# Patient Record
Sex: Male | Born: 1985 | Hispanic: No | Marital: Married | Smoking: Former smoker
Health system: Southern US, Community
[De-identification: ages and names within clinical notes are randomized; demographics above are authoritative.]

## PROBLEM LIST (undated history)

## (undated) DIAGNOSIS — K219 Gastro-esophageal reflux disease without esophagitis: Secondary | ICD-10-CM

## (undated) DIAGNOSIS — F419 Anxiety disorder, unspecified: Secondary | ICD-10-CM

## (undated) DIAGNOSIS — B9681 Helicobacter pylori [H. pylori] as the cause of diseases classified elsewhere: Secondary | ICD-10-CM

## (undated) DIAGNOSIS — R1013 Epigastric pain: Secondary | ICD-10-CM

## (undated) DIAGNOSIS — F32A Depression, unspecified: Secondary | ICD-10-CM

## (undated) HISTORY — DX: Gastro-esophageal reflux disease without esophagitis: K21.9

## (undated) HISTORY — DX: Helicobacter pylori (H. pylori) as the cause of diseases classified elsewhere: B96.81

## (undated) HISTORY — DX: Anxiety disorder, unspecified: F41.9

## (undated) HISTORY — DX: Epigastric pain: R10.13

## (undated) HISTORY — DX: Depression, unspecified: F32.A

---

## 2020-01-05 ENCOUNTER — Ambulatory Visit (HOSPITAL_COMMUNITY)
Admission: EM | Admit: 2020-01-05 | Discharge: 2020-01-05 | Disposition: A | Discharge status: Home / Self Care | Payer: Self-pay | Attending: Family Medicine | Admitting: Family Medicine

## 2020-01-05 DIAGNOSIS — R1013 Epigastric pain: Secondary | ICD-10-CM

## 2020-01-05 DIAGNOSIS — Z789 Other specified health status: Secondary | ICD-10-CM

## 2020-01-05 MED ORDER — SUCRALFATE 1 G PO TABS: 1.0000 g | ORAL_TABLET | Freq: Three times a day (TID) | ORAL | 0 refills | Status: DC

## 2020-01-05 NOTE — ED Triage Notes (Signed)
Via friend who speaks English  Pt here for medication refill  Hx of H. Pylori and Vertigo

## 2020-01-05 NOTE — Discharge Instructions (Addendum)
This is an urgent care center for minor medical problems We cannot do testing to diagnose your ongoing medical problems Call your refugee representative or social worker to obtain a primary care doctor  I will prescribe medicine for your stomach pain Carafate can be taken 4 times a day

## 2020-01-07 NOTE — ED Provider Notes (Signed)
Unable to complete a medical visit Patient speaks no english Unable after over an hour of trying to locate an interpreter for Pashto He called a colleague who speaks broken english that tells me patient came from refugee center 6 days ago and wants evaluation for chronic abdominal pain He has a Rx for omeprazole I am not able to get additional information to treat safely Is discharged to return another time,needs to call his refugee contacts   Eustace Moore, MD 01/07/20 1825

## 2020-01-26 ENCOUNTER — Ambulatory Visit (HOSPITAL_COMMUNITY)
Admission: EM | Admit: 2020-01-26 | Discharge: 2020-01-26 | Disposition: A | Discharge status: Home / Self Care | Payer: Medicaid Other | Attending: Student | Admitting: Student

## 2020-01-26 ENCOUNTER — Other Ambulatory Visit: Payer: Self-pay

## 2020-01-26 DIAGNOSIS — Z8619 Personal history of other infectious and parasitic diseases: Secondary | ICD-10-CM

## 2020-01-26 DIAGNOSIS — K279 Peptic ulcer, site unspecified, unspecified as acute or chronic, without hemorrhage or perforation: Secondary | ICD-10-CM | POA: Diagnosis not present

## 2020-01-26 DIAGNOSIS — R519 Headache, unspecified: Secondary | ICD-10-CM | POA: Diagnosis not present

## 2020-01-26 DIAGNOSIS — R1013 Epigastric pain: Secondary | ICD-10-CM | POA: Diagnosis not present

## 2020-01-26 MED ORDER — DOCUSATE SODIUM 250 MG PO CAPS: 250.0000 mg | ORAL_CAPSULE | Freq: Every day | ORAL | 0 refills | Status: DC

## 2020-01-26 MED ORDER — FAMOTIDINE 20 MG PO TABS: 20.0000 mg | ORAL_TABLET | Freq: Two times a day (BID) | ORAL | 2 refills | Status: DC

## 2020-01-26 MED ORDER — OMEPRAZOLE 40 MG PO CPDR: 40.0000 mg | DELAYED_RELEASE_CAPSULE | Freq: Every day | ORAL | 2 refills | Status: DC

## 2020-01-26 NOTE — ED Triage Notes (Signed)
Pt presents with abdominal pain and headaches X 2 years. He states he just came from his country and has not been seen for the pain and states now the pain is worse. Pt states when he eats food he cannot hold anything down.

## 2020-01-26 NOTE — ED Provider Notes (Addendum)
[facility]    CSN: [id number] Arrival date & time: 01/26/20  1045      History   Chief Complaint Chief Complaint  Patient presents with  . Abdominal Pain  . Headache    HPI Kevin Space is a 35 y.o. male presenting with abdominal pain and headaches for >2 years. This patient is a refugee and has not established care with PCP or specialists yet. He has a long history of GI issues, and was treated for peptic ulcer/H pylori in his home country. Today he is presenting for 2+ years of abdominal pain. States he was treated for peptic ulcer in home country, and was also treated for H Pylori. However, for the last 2 years, endorses epigastric pain that is relieved by eating and defecation. Endorses constipation and straining on the toilet. During episodes of pain and constipation, he experiences headaches and dizziness. Symptoms are poorly controlled by Omeprazole. Denies worst headache of life, thunderclap headache, weakness/sensation changes in arms/legs, vision changes, shortness of breath, chest pain/pressure, photophobia, phonophobia, n/v/d. Denies red stool or black/tarry stool. He is having one bowel movement daily. Denies hearing changes, vision changes, tinnitus.   HPI  No past medical history on file.  There are no problems to display for this patient.    Home Medications    Prior to Admission medications   Medication Sig Start Date End Date Taking? Authorizing Provider  docusate sodium (COLACE) 250 MG capsule Take 1 capsule (250 mg total) by mouth daily. 01/26/20  Yes Rhys Martini, PA-C  famotidine (PEPCID) 20 MG tablet Take 1 tablet (20 mg total) by mouth 2 (two) times daily. 01/26/20  Yes Rhys Martini, PA-C  omeprazole (PRILOSEC) 40 MG capsule Take 1 capsule (40 mg total) by mouth daily. 01/26/20 02/25/20 Yes Rhys Martini, PA-C  sucralfate (CARAFATE) 1 g tablet Take 1 tablet (1 g total) by mouth 4 (four) times daily -  with meals and at bedtime. 01/05/20   Yes Eustace Moore, MD  calcium carbonate (TUMS - DOSED IN MG ELEMENTAL CALCIUM) 500 MG chewable tablet Chew 1 tablet by mouth 3 (three) times daily.    [provider]  hydrOXYzine (ATARAX/VISTARIL) 25 MG tablet Take 25 mg by mouth 3 (three) times daily.    [provider]  meclizine (ANTIVERT) 12.5 MG tablet Take 12.5 mg by mouth 3 (three) times daily.    [provider]    Family History No family history on file.  Social History     Allergies   Patient has no known allergies.   Review of Systems Review of Systems  Gastrointestinal: Positive for abdominal pain and constipation.  Neurological: Positive for dizziness and headaches.  All other systems reviewed and are negative.    Physical Exam Triage Vital Signs ED Triage Vitals [01/26/20 1201]  Enc Vitals Group     BP 124/72     Pulse Rate 65     Resp 16     Temp 97.8 F (36.6 C)     Temp Source Oral     SpO2 100 %     Weight      Height      Head Circumference      Peak Flow      Pain Score      Pain Loc      Pain Edu?      Excl. in GC?    No data found.  Updated Vital Signs BP 124/72   Pulse  65   Temp 97.8 F (36.6 C) (Oral)   Resp 16   SpO2 100%   Visual Acuity Right Eye Distance:   Left Eye Distance:   Bilateral Distance:    Right Eye Near:   Left Eye Near:    Bilateral Near:     Physical Exam Vitals reviewed.  Constitutional:      General: He is not in acute distress.    Appearance: Normal appearance. He is not ill-appearing.  HENT:     Head: Normocephalic and atraumatic.  Cardiovascular:     Rate and Rhythm: Normal rate and regular rhythm.     Heart sounds: Normal heart sounds.  Pulmonary:     Effort: Pulmonary effort is normal.     Breath sounds: Normal breath sounds. No wheezing, rhonchi or rales.  Abdominal:     General: Bowel sounds are normal. There is no distension.     Palpations: Abdomen is soft. There is no mass.     Tenderness: There is  abdominal tenderness in the epigastric area. There is no right CVA tenderness, left CVA tenderness, guarding or rebound. Negative signs include Murphy's sign, Rovsing's sign and McBurney's sign.     Comments: Bowel sounds positive in all 4 quadrants.  Negative Murphy Sign, Rovsing's sign, McBurney point tenderness.   Musculoskeletal:     Cervical back: Normal range of motion and neck supple. No rigidity.  Lymphadenopathy:     Cervical: No cervical adenopathy.  Neurological:     General: No focal deficit present.     Mental Status: He is alert and oriented to person, place, and time. Mental status is at baseline.     Cranial Nerves: Cranial nerves are intact. No cranial nerve deficit or facial asymmetry.     Sensory: Sensation is intact. No sensory deficit.     Motor: Motor function is intact. No weakness.     Coordination: Coordination is intact. Coordination normal.     Gait: Gait is intact. Gait normal.     Comments: CN 2-12 intact. No weakness or numbness in UEs or LEs.  Psychiatric:        Mood and Affect: Mood normal.        Behavior: Behavior normal.        Thought Content: Thought content normal.        Judgment: Judgment normal.      UC Treatments / Results  Labs (all labs ordered are listed, but only abnormal results are displayed) Labs Reviewed - No data to display  EKG   Radiology No results found.  Procedures Procedures (including critical care time)  Medications Ordered in UC Medications - No data to display  Initial Impression / Assessment and Plan / UC Course  I have reviewed the triage vital signs and the nursing notes.  Pertinent labs & imaging results that were available during my care of the patient were reviewed by me and considered in my medical decision making (see chart for details).     For peptic ulcer disease/history of H Pylori, continue omeprazole. Trial of Pepcid and colace. rec good hydration, fibrous diet. Discussed that patient needs to  be followed by GI for improved control of his chronic GI issues. Encouraged him to f/u with GI if he continues to experience these symptoms despite treatment; information provided.   For headaches and dizziness, benign neuro exam today. Tylenol/ibuprofen. F/u with [place] or PCP if headaches persist despite treatment. Head straight to ED if worst headache of life, syncope,  etc.   Using language line, spent over 40 minutes obtaining H&P, performing physical, discussing results, treatment plan and plan for follow-up with patient. Patient agrees with plan.    Final Clinical Impressions(s) / UC Diagnoses   Final diagnoses:  History of Helicobacter pylori infection  Epigastric pain  Peptic ulcer  Nonintractable headache, unspecified chronicity pattern, unspecified headache type     Discharge Instructions     -Continue taking Omeprazole as prescribed by your previous doctor. Refill provided today -Start Famotidine, twice daily with meals  -Try Colace stool softener 1 capsule daily for constipation. You can take this as needed.  -If your symptoms worsen/persist despite treatment, please follow-up with gastroenterologist. Information provided below.    ED Prescriptions    Medication Sig Dispense Auth. Provider   omeprazole (PRILOSEC) 40 MG capsule Take 1 capsule (40 mg total) by mouth daily. 30 capsule Rhys Martini, PA-C   famotidine (PEPCID) 20 MG tablet Take 1 tablet (20 mg total) by mouth 2 (two) times daily. 30 tablet Rhys Martini, PA-C   docusate sodium (COLACE) 250 MG capsule Take 1 capsule (250 mg total) by mouth daily. 10 capsule Rhys Martini, PA-C     PDMP not reviewed this encounter.   Rhys Martini, PA-C 01/26/20 1348    Rhys Martini, PA-C 01/26/20 1348

## 2020-01-26 NOTE — Discharge Instructions (Addendum)
-  Continue taking Omeprazole as prescribed by your previous doctor. Refill provided today -Start Famotidine, twice daily with meals  -Try Colace stool softener 1 capsule daily for constipation. You can take this as needed.  -If your symptoms worsen/persist despite treatment, please follow-up with gastroenterologist. Information provided below.

## 2020-02-01 ENCOUNTER — Other Ambulatory Visit: Payer: Self-pay

## 2020-02-01 DIAGNOSIS — L02413 Cutaneous abscess of right upper limb: Secondary | ICD-10-CM

## 2020-02-01 LAB — GLUCOSE, POCT (MANUAL RESULT ENTRY): POC Glucose: 95 mg/dl (ref 70–99)

## 2020-02-01 NOTE — Progress Notes (Signed)
CBG 95. Patient advised to seek medical attention if pain/swelling and redness is worse,or has a fever.He is to take OTC analgesics for pain control.  Arman Bogus RN BSn PCCN  [facility] [phone]-cell [phone]-office

## 2020-02-01 NOTE — Congregational Nurse Program (Signed)
Patient came in with c/o swelling and redness right arm. Looks like an abscess  He has appointment with PCP as new patient 02/09/20 at 3;15PM. He would like to wait for the and see the provider at that time.Blood sugar check 95.  Arman Bogus RN BSn PCCN  [facility] [phone]-cell [phone]-office

## 2020-02-09 ENCOUNTER — Encounter: Payer: Self-pay | Admitting: Student

## 2020-02-09 ENCOUNTER — Other Ambulatory Visit: Payer: Self-pay

## 2020-02-09 ENCOUNTER — Ambulatory Visit: Payer: Medicaid Other | Admitting: Student

## 2020-02-09 VITALS — BP 118/67 | HR 74 | Wt 175.9 lb

## 2020-02-09 DIAGNOSIS — K2970 Gastritis, unspecified, without bleeding: Secondary | ICD-10-CM | POA: Diagnosis not present

## 2020-02-09 DIAGNOSIS — K30 Functional dyspepsia: Secondary | ICD-10-CM | POA: Insufficient documentation

## 2020-02-09 DIAGNOSIS — R1013 Epigastric pain: Secondary | ICD-10-CM | POA: Diagnosis not present

## 2020-02-09 DIAGNOSIS — K219 Gastro-esophageal reflux disease without esophagitis: Secondary | ICD-10-CM | POA: Insufficient documentation

## 2020-02-09 DIAGNOSIS — Z8619 Personal history of other infectious and parasitic diseases: Secondary | ICD-10-CM | POA: Diagnosis not present

## 2020-02-09 DIAGNOSIS — B9681 Helicobacter pylori [H. pylori] as the cause of diseases classified elsewhere: Secondary | ICD-10-CM | POA: Diagnosis not present

## 2020-02-09 DIAGNOSIS — K921 Melena: Secondary | ICD-10-CM

## 2020-02-09 MED ORDER — HYDROXYZINE HCL 25 MG PO TABS: 25.0000 mg | ORAL_TABLET | Freq: Three times a day (TID) | ORAL | 2 refills | Status: DC

## 2020-02-09 MED ORDER — OMEPRAZOLE 40 MG PO CPDR
40.0000 mg | DELAYED_RELEASE_CAPSULE | Freq: Every day | ORAL | 2 refills | Status: DC
Start: 2020-02-09 — End: 2020-03-09

## 2020-02-09 MED ORDER — CALCIUM CARBONATE ANTACID 500 MG PO CHEW: 1.0000 | CHEWABLE_TABLET | Freq: Three times a day (TID) | ORAL | 2 refills | Status: DC

## 2020-02-09 NOTE — Patient Instructions (Signed)
??    ?  ~??~ ~?   ? ? .  ? ? ? ~??:     GI   ~??. ?      ?  ? .   ~?  ? ? ~? ? .    ? ?   ~? ?   ? ~?  . ?  ?   ?  ??   ?!    Oscar Hill,  It was a pleasure seeing you in the clinic today.   We discussed the following:  1. I have placed a referral to GI. They will call you to schedule an appointment.  2. Please come back in 1 month.  Please call our clinic at [phone] if you have any questions or concerns. The best time to call is Monday-Friday from 9am-4pm, but there is someone available 24/7 at the same number. If you need medication refills, please notify your pharmacy one week in advance and they will send [place] a request.   Thank you for letting us take part in your care. We look forward to seeing you next time!

## 2020-02-09 NOTE — Assessment & Plan Note (Signed)
Patient with history of severe GERD on omeprazole 40mg  daily and tums, still not well controlled. I have placed a referral to GI for further evaluation of this and his abdominal pain (see separate problem) because I believe that both are likely interrelated.  -continue omeprazole 40mg  daily -continue tums TID prn -GI referral

## 2020-02-09 NOTE — Assessment & Plan Note (Signed)
Patient presenting with epigastric pain that has been ongoing for 1.5 years, but has been progressively worsening over the past 5 months. He reports that the pain increases with food intake and is significantly worse at night when lays down. He feels like it is heartburn, but it does not improve with daily omeprazole and tums. He does report that he has some symptomatic relief when he takes omeprazole twice. He reports associated loss of appetite and he does endorse occasional melena as well. He denies fevers, chills, N/V, hematochezia, hx of cancers. Patient does have a history of H pylori gastritis that he reports he was treated for in [place] (with confirmed eradication as per patient). Patient does have a history of tobacco use and currently smokes 1 cigarette/day.   At this point, my differentials include repeat infection with H pylori vs refractory GERD. Unfortunately, I cannot test for H pylori at this time as patient has been regularly taking his PPI. Patient does have red flag symptoms including failure of PPI therapy and melena, so I will place urgent referral to GI for further evaluation of his symptoms.  Plan: -continue PPI and tums -urgent GI referral -f/u in 1 month

## 2020-02-09 NOTE — Progress Notes (Signed)
   CC: abdominal pain  HPI:  [name] is a 35 y.o. male with   Past Medical History:  Diagnosis Date  . GERD (gastroesophageal reflux disease)   . Helicobacter pylori gastritis      No past surgical history.  Medications: Omeprazole 40mg  daily Tums 500mg  TID prn Hydroxyzine 25mg    Allergies: Reports body shaking with sucralfate and famotidine  Social Hx: Lives with brother here in [place]. Works in . Reports smoking 1 cigarette/day after coming to [place] but in he used to smoke local tobacco quite a bit. No alcohol or recreational drug use.  Family Hx: Negative pertinent family hx  Review of Systems:  Negative aside from that listed in individualized A&P.  Physical Exam:  Vitals:   02/09/20 1522  BP: 118/67  Pulse: 74  SpO2: 98%  Weight: 175 lb 14.4 oz (79.8 kg)   Physical Exam Constitutional:      Appearance: He is well-developed. He is not ill-appearing.  HENT:     Head: Normocephalic and atraumatic.  Eyes:     Extraocular Movements: Extraocular movements intact.     Pupils: Pupils are equal, round, and reactive to light.     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 Denies pain.  Cardiovascular:     Rate and Rhythm: Normal rate and regular rhythm.     Heart sounds: Normal heart sounds. No murmur heard. No friction rub. No gallop.   Pulmonary:     Effort: Pulmonary effort is normal.     Breath sounds: Normal breath sounds. No wheezing, rhonchi or rales.  Abdominal:     General: There is distension.     Comments: Soft, slightly distended abdomen, TTP in epigastric region. +BS. Negative Rovsings and McBurney's.  Skin:    General: Skin is warm and dry.  Neurological:     General: No focal deficit present.     Mental Status: He is alert and oriented to person, place, and time.  Psychiatric:        Mood and Affect: Mood normal.        Behavior: Behavior normal.      Assessment & Plan:   See  Encounters Tab for problem based charting.  Patient discussed with Dr. Event organiser

## 2020-02-09 NOTE — Assessment & Plan Note (Signed)
Patient with history of H pylori gastritis in [place]. Reports that he was treated for it and had confirmed eradication of it while in [place].

## 2020-02-13 NOTE — Progress Notes (Signed)
Internal Medicine Clinic Attending  Case discussed with Dr. Jinwala  At the time of the visit.  We reviewed the resident's history and exam and pertinent patient test results.  I agree with the assessment, diagnosis, and plan of care documented in the resident's note.  

## 2020-02-23 ENCOUNTER — Encounter: Payer: Self-pay | Admitting: Physician Assistant

## 2020-02-24 ENCOUNTER — Encounter: Payer: Self-pay | Admitting: Physician Assistant

## 2020-03-08 ENCOUNTER — Encounter: Payer: Medicaid Other | Admitting: Internal Medicine

## 2020-03-09 ENCOUNTER — Other Ambulatory Visit: Payer: Self-pay

## 2020-03-09 ENCOUNTER — Other Ambulatory Visit (INDEPENDENT_AMBULATORY_CARE_PROVIDER_SITE_OTHER): Payer: Medicaid Other

## 2020-03-09 ENCOUNTER — Encounter: Payer: Self-pay | Admitting: Physician Assistant

## 2020-03-09 ENCOUNTER — Ambulatory Visit: Payer: Medicaid Other | Admitting: Physician Assistant

## 2020-03-09 VITALS — BP 90/60 | HR 70 | Ht 68.0 in | Wt 173.0 lb

## 2020-03-09 DIAGNOSIS — R1013 Epigastric pain: Secondary | ICD-10-CM

## 2020-03-09 DIAGNOSIS — R42 Dizziness and giddiness: Secondary | ICD-10-CM

## 2020-03-09 DIAGNOSIS — K219 Gastro-esophageal reflux disease without esophagitis: Secondary | ICD-10-CM | POA: Diagnosis not present

## 2020-03-09 DIAGNOSIS — Z8619 Personal history of other infectious and parasitic diseases: Secondary | ICD-10-CM

## 2020-03-09 LAB — CBC WITH DIFFERENTIAL/PLATELET
Basophils Absolute: 0 10*3/uL (ref 0.0–0.1)
Basophils Relative: 0.7 % (ref 0.0–3.0)
Eosinophils Absolute: 0.1 10*3/uL (ref 0.0–0.7)
Eosinophils Relative: 2.8 % (ref 0.0–5.0)
HCT: 44.7 % (ref 39.0–52.0)
Hemoglobin: 15.5 g/dL (ref 13.0–17.0)
Lymphocytes Relative: 43 % (ref 12.0–46.0)
Lymphs Abs: 2 10*3/uL (ref 0.7–4.0)
MCHC: 34.7 g/dL (ref 30.0–36.0)
MCV: 87.9 fl (ref 78.0–100.0)
Monocytes Absolute: 0.4 10*3/uL (ref 0.1–1.0)
Monocytes Relative: 7.8 % (ref 3.0–12.0)
Neutro Abs: 2.1 10*3/uL (ref 1.4–7.7)
Neutrophils Relative %: 45.7 % (ref 43.0–77.0)
Platelets: 219 10*3/uL (ref 150.0–400.0)
RBC: 5.08 Mil/uL (ref 4.22–5.81)
RDW: 12.6 % (ref 11.5–15.5)
WBC: 4.7 10*3/uL (ref 4.0–10.5)

## 2020-03-09 LAB — COMPREHENSIVE METABOLIC PANEL
ALT: 12 U/L (ref 0–53)
AST: 14 U/L (ref 0–37)
Albumin: 4.1 g/dL (ref 3.5–5.2)
Alkaline Phosphatase: 80 U/L (ref 39–117)
BUN: 8 mg/dL (ref 6–23)
CO2: 29 mEq/L (ref 19–32)
Calcium: 9.1 mg/dL (ref 8.4–10.5)
Chloride: 108 mEq/L (ref 96–112)
Creatinine, Ser: 0.76 mg/dL (ref 0.40–1.50)
GFR: 115.84 mL/min (ref >60.00)
Glucose, Bld: 94 mg/dL (ref 70–99)
Potassium: 3.9 mEq/L (ref 3.5–5.1)
Sodium: 141 mEq/L (ref 135–145)
Total Bilirubin: 1 mg/dL (ref 0.2–1.2)
Total Protein: 6.9 g/dL (ref 6.0–8.3)

## 2020-03-09 LAB — LIPASE: Lipase: 58 U/L (ref 11.0–59.0)

## 2020-03-09 MED ORDER — OMEPRAZOLE 40 MG PO CPDR: 40.0000 mg | DELAYED_RELEASE_CAPSULE | Freq: Every day | ORAL | 5 refills | Status: DC

## 2020-03-09 NOTE — Progress Notes (Signed)
Chief Complaint: GERD  HPI:    Oscar Hill is a 35 year old male from [place], with a past medical history as listed below, who presents clinic today accompanied by an interpreter, who was referred to me by Oscar Puma, MD for a complaint of GERD.      01/26/2020 patient seen in the urgent care for abdominal pain and headaches.  Described a long history of GI issues treated for peptic ulcer/H. pylori in his home country.  Described epigastric pain that was relieved by eating and defecation for the past 2 years as well as some constipation.  At that time continued on omeprazole 40 mg daily and started Pepcid 20 mg twice daily with meals as well as a Colace stool softener once daily.    02/09/2020 patient seen by PCP and at that time reported an allergy to sucralfate and famotidine with "shaking".  He described epigastric pain had been going on for almost 2 years progressively worsening the past 5 months.  Did not improve with daily omeprazole and Tums.  Associated loss of appetite and some melena.  At that time reported that he was treated in [place] for H. pylori with confirmed eradication.    Today, the patient presents to clinic accompanied by interpreter.  He brings with him records that show he had H pylori back 12/26/2018 and apparently had a fecal antigen test that was negative at that time.  Describes being treated for this while he lived in [place].  Tells me he continues now with epigastric pain and burning.  He feels like when his stomach is hurting then it radiates all the way up his throat and into his head and he develops a headache and will get dizzy.  Tells me this is worse after eating anything or when he is "thinking about something stressful", like his family left in [place].  Describes having melena for a straight 3 months but the last time he saw any black tarry stools about a month ago.  Currently using Omeprazole twice daily which does help some with symptoms.    Denies  fever, chills, weight loss or symptoms that awaken him from sleep.  Past Medical History:  Diagnosis Date  . GERD (gastroesophageal reflux disease)   . Helicobacter pylori gastritis     Current Outpatient Medications  Medication Sig Dispense Refill  . calcium carbonate (TUMS - DOSED IN MG ELEMENTAL CALCIUM) 500 MG chewable tablet Chew 1 tablet (200 mg of elemental calcium total) by mouth 3 (three) times daily. 90 tablet 2  . docusate sodium (COLACE) 250 MG capsule Take 1 capsule (250 mg total) by mouth daily. 10 capsule 0  . famotidine (PEPCID) 20 MG tablet Take 1 tablet (20 mg total) by mouth 2 (two) times daily. 30 tablet 2  . hydrOXYzine (ATARAX/VISTARIL) 25 MG tablet Take 1 tablet (25 mg total) by mouth 3 (three) times daily. 30 tablet 2  . meclizine (ANTIVERT) 12.5 MG tablet Take 12.5 mg by mouth 3 (three) times daily.    Marland Kitchen omeprazole (PRILOSEC) 40 MG capsule Take 1 capsule (40 mg total) by mouth daily. 30 capsule 2  . sucralfate (CARAFATE) 1 g tablet Take 1 tablet (1 g total) by mouth 4 (four) times daily -  with meals and at bedtime. 60 tablet 0   No current facility-administered medications for this visit.    Allergies as of 03/09/2020  . (No Known Allergies)    Family History  Problem Relation Age of Onset  . Healthy Mother   .  Healthy Father     Social History   Socioeconomic History  . Marital status: Unknown    Spouse name: Not on file  . Number of children: Not on file  . Years of education: Not on file  . Highest education level: Not on file  Occupational History  . Occupation: carpentry  Tobacco Use  . Smoking status: Current Every Day Smoker    Types: Cigarettes  . Smokeless tobacco: Former Neurosurgeon  . Tobacco comment: Used to smoke local tobacco in [place], now smokes 1 cigarette/day here.  Substance and Sexual Activity  . Alcohol use: Never  . Drug use: Never  . Sexual activity: Not on file  Other Topics Concern  . Not on file  Social History  Narrative  . Not on file   Social Determinants of Health   Financial Resource Strain: Not on file  Food Insecurity: Not on file  Transportation Needs: Not on file  Physical Activity: Not on file  Stress: Not on file  Social Connections: Not on file  Intimate Partner Violence: Not on file    Review of Systems:    Constitutional: No weight loss, fever or chills Skin: No rash Cardiovascular: No chest pain  Respiratory: No SOB  Gastrointestinal: See HPI and otherwise negative Genitourinary: No dysuria  Neurological: No headache, dizziness or syncope Musculoskeletal: No new muscle or joint pain Hematologic: No bleeding  Psychiatric: No history of depression or anxiety   Physical Exam:  Vital signs: BP 90/60   Pulse 70   Ht 5\' 8"  (1.727 m)   Wt 173 lb (78.5 kg)   BMI 26.30 kg/m   Constitutional:   Pleasant male appears to be in NAD, Well developed, Well nourished, alert and cooperative Head:  Normocephalic and atraumatic. Eyes:   PEERL, EOMI. No icterus. Conjunctiva pink. Ears:  Normal auditory acuity. Neck:  Supple Throat: Oral cavity and pharynx without inflammation, swelling or lesion.  Respiratory: Respirations even and unlabored. Lungs clear to auscultation bilaterally.   No wheezes, crackles, or rhonchi.  Cardiovascular: Normal S1, S2. No MRG. Regular rate and rhythm. No peripheral edema, cyanosis or pallor.  Gastrointestinal:  Soft, nondistended, moderate epigastric TTP with involuntary guarding.  Normal bowel sounds. No appreciable masses or hepatomegaly. Rectal:  Not performed.  Msk:  Symmetrical without gross deformities. Without edema, no deformity or joint abnormality.  Neurologic:  Alert and  oriented x4;  grossly normal neurologically.  Skin:   Dry and intact without significant lesions or rashes. Psychiatric:  Demonstrates good judgement and reason without abnormal affect or behaviors.  No recent labs or imaging.  Assessment: 1. Epigastric pain: Worse  after eating and when anxious; likely gastritis 2. GERD: Continuous symptoms per the patient, some better with omeprazole twice daily 3. History of H. pylori: Negative fecal antigen 12/26/2018  Plan: 1. Scheduled patient for an EGD in the [facility] with Dr. 12/28/2018. Patient was provided with a detailed list of risks for the procedure and he agrees to proceed. 2. Refilled Omeprazole 40 mg twice daily, 30-60 minutes before breakfast and dinner #60 with five refills. 3. Reviewed patient's records that he brought with him, some of these are in a different language and cannot be understood, but H. pylori results are as in HPI. 4. Patient was provided with a work note for today. 5. Ordered labs including CBC, CMP and lipase 6. Patient to follow in clinic per recommendations of Dr. Meridee Score after time of procedure.  Meridee Score, PA-C [facility] 03/09/2020, 9:09 AM  Cc: Oscar Puma, MD

## 2020-03-09 NOTE — Patient Instructions (Signed)
If you are age 37 or older, your body mass index should be between 23-30. Your Body mass index is 26.3 kg/m. If this is out of the aforementioned range listed, please consider follow up with your Primary Care Provider.  If you are age 38 or younger, your body mass index should be between 19-25. Your Body mass index is 26.3 kg/m. If this is out of the aformentioned range listed, please consider follow up with your Primary Care Provider.   Your provider has requested that you go to the basement level for lab work before leaving today. Press "B" on the elevator. The lab is located at the first door on the left as you exit the elevator.  Due to recent changes in healthcare laws, you may see the results of your imaging and laboratory studies on MyChart before your provider has had a chance to review them.  We understand that in some cases there may be results that are confusing or concerning to you. Not all laboratory results come back in the same time frame and the provider may be waiting for multiple results in order to interpret others.  Please give [place] 48 hours in order for your provider to thoroughly review all the results before contacting the office for clarification of your results.   Thank you for choosing me and  Gastroenterology.  Hyacinth Meeker, PA-C

## 2020-03-09 NOTE — Progress Notes (Signed)
Pashto interpreter was not available 03/09/20 will call back later.

## 2020-03-10 NOTE — Progress Notes (Signed)
Attending Physician's Attestation   I have reviewed the chart.   I agree with the Advanced Practitioner's note, impression, and recommendations with any updates as below.    Gabriel Mansouraty, MD [facility]oscopy Office # [phone]  

## 2020-03-12 ENCOUNTER — Encounter: Payer: Medicaid Other | Admitting: Internal Medicine

## 2020-03-20 ENCOUNTER — Encounter: Payer: Self-pay | Admitting: Internal Medicine

## 2020-03-20 ENCOUNTER — Ambulatory Visit: Payer: Medicaid Other | Admitting: Internal Medicine

## 2020-03-20 DIAGNOSIS — F4321 Adjustment disorder with depressed mood: Secondary | ICD-10-CM

## 2020-03-20 DIAGNOSIS — R1013 Epigastric pain: Secondary | ICD-10-CM | POA: Diagnosis present

## 2020-03-20 MED ORDER — PEPTO-BISMOL 524 MG/30ML PO SUSP: 30.0000 mL | Freq: Three times a day (TID) | ORAL | 2 refills | Status: DC | PRN

## 2020-03-20 MED ORDER — SERTRALINE HCL 50 MG PO TABS: 50.0000 mg | ORAL_TABLET | Freq: Every day | ORAL | 2 refills | Status: DC

## 2020-03-20 NOTE — Progress Notes (Signed)
   CC: Abdominal Pain   HPI:  [name] is a 35 y.o. M/F, with a PMH noted below, who presents to the clinic follow up on his abdominal pain. To see the management of their acute and chronic conditions, please see the A&P note under the Encounters tab.   Past Medical History:  Diagnosis Date  . GERD (gastroesophageal reflux disease)   . Helicobacter pylori gastritis    Review of Systems:   Review of Systems  Constitutional: Negative for chills, fever, malaise/fatigue and weight loss.  Gastrointestinal: Positive for abdominal pain and nausea. Negative for blood in stool, constipation, diarrhea, heartburn and vomiting.  Neurological: Negative for dizziness.     Physical Exam:  Vitals:   03/20/20 0921  BP: 118/72  Pulse: 60  SpO2: 100%  Weight: 172 lb 9.6 oz (78.3 kg)   Physical Exam Constitutional:      General: He is not in acute distress.    Appearance: Normal appearance. He is not ill-appearing or toxic-appearing.  Cardiovascular:     Rate and Rhythm: Normal rate and regular rhythm.     Pulses: Normal pulses.     Heart sounds: Normal heart sounds. No murmur heard. No friction rub. No gallop.   Pulmonary:     Effort: Pulmonary effort is normal. No respiratory distress.     Breath sounds: No wheezing, rhonchi or rales.  Abdominal:     General: Abdomen is flat. Bowel sounds are normal.     Palpations: Abdomen is soft. There is no mass.     Hernia: No hernia is present.     Comments: Mild tenderness in the epigastric region to palpitation  Neurological:     Mental Status: He is alert and oriented to person, place, and time.  Psychiatric:        Mood and Affect: Mood normal.        Behavior: Behavior normal.      Assessment & Plan:   See Encounters Tab for problem based charting.  Patient discussed with Dr. Mayford Knife

## 2020-03-20 NOTE — Patient Instructions (Addendum)
To Mr. Scerbo,  It was a pleasure meeting you today. Today we talked about your stomach pain. I am putting in a medication called pepto bismol which will coat your stomach and hopefully alleviate your pain. We will also call your GI specialist and see when your procedure is scheduled. We have also started you on a medication for trying to make you feel more like yourself. Have a good day!  Sincerely,  Dolan Amen, MD

## 2020-03-21 ENCOUNTER — Encounter: Payer: Self-pay | Admitting: Internal Medicine

## 2020-03-21 DIAGNOSIS — F4321 Adjustment disorder with depressed mood: Secondary | ICD-10-CM | POA: Insufficient documentation

## 2020-03-21 NOTE — Assessment & Plan Note (Signed)
Patient presents with feelings of feeling "down" not in his "right mind." He does not enjoy the things he used to, since moving to the [place]. He sleeps very little, but will sit in his room when he finishes his work day and no longer socializes. He hopes to find something that would assist him feeling more like himself at today's visit.   A/P: Patient endorses depressed mood, insomnia, anhedonia, appetite changes, and difficulties with his process since moving to the [place]. 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 Will start on zoloft today and reassess in 4-6 weeks.  - Zoloft 50 mg daily. Told patient full effect of medication may take up to 6 weeks for full effect.

## 2020-03-21 NOTE — Assessment & Plan Note (Signed)
Patient presents to the clinic for follow up for his abdominal pain. He states that the protonix and tums are not helping. He has seen GI and they have scheduled an appointment for an EGD with them, but is unsure when the procedure will occur. Patient repeatedly states his frustration as to why nothing is being done about his pain.   We discussed that it may take a while for his procedure but will reach out to his office. In the meantime we discussed the etiology of possible ulcers. We additionally discussed possible reinfection of his H. Pylori, which patient disagrees with as he was previously treated for H. Pylori. He additionally notes that he may have had PUD when [place], but was unsure if it was related to his GERD or H. Pylori.   Patient reiterates that pain is constant but acutely worsens when he eats food. He describes the pain as sharp and spasmadic.   He denies hematochezia, melena, constipation, or diarrhea. Endorses occasional nausea, but no vomiting.   A/P  Differential continues to remain wide. PUD v Gastric ulcer v H pylori reinfection v GERD. Pending EGD study. Do not see scheduled date, but see the AP note stating patient has been scheduled. Will call to see when patient is scheduled. In the meantime, will add pepto bismol for possible ulcer. Instructed patient that common side effect is dark, black stool. Patient voiced understanding.  - Continue PPI  - Continue tums - Start Pepto Bismol  - Patient to follow up with GI

## 2020-03-22 ENCOUNTER — Emergency Department (HOSPITAL_COMMUNITY)
Admission: EM | Admit: 2020-03-22 | Discharge: 2020-03-23 | Disposition: A | Discharge status: Home / Self Care | Payer: Medicaid Other | Attending: Emergency Medicine | Admitting: Emergency Medicine

## 2020-03-22 ENCOUNTER — Other Ambulatory Visit: Payer: Self-pay

## 2020-03-22 DIAGNOSIS — R109 Unspecified abdominal pain: Secondary | ICD-10-CM

## 2020-03-22 DIAGNOSIS — R110 Nausea: Secondary | ICD-10-CM | POA: Diagnosis not present

## 2020-03-22 DIAGNOSIS — R1033 Periumbilical pain: Secondary | ICD-10-CM | POA: Diagnosis not present

## 2020-03-22 DIAGNOSIS — Z87891 Personal history of nicotine dependence: Secondary | ICD-10-CM | POA: Diagnosis not present

## 2020-03-22 DIAGNOSIS — R1013 Epigastric pain: Secondary | ICD-10-CM | POA: Insufficient documentation

## 2020-03-22 DIAGNOSIS — Z8619 Personal history of other infectious and parasitic diseases: Secondary | ICD-10-CM | POA: Diagnosis not present

## 2020-03-22 DIAGNOSIS — R634 Abnormal weight loss: Secondary | ICD-10-CM | POA: Insufficient documentation

## 2020-03-22 LAB — COMPREHENSIVE METABOLIC PANEL
ALT: 16 U/L (ref 0–44)
AST: 19 U/L (ref 15–41)
Albumin: 4.2 g/dL (ref 3.5–5.0)
Alkaline Phosphatase: 88 U/L (ref 38–126)
Anion gap: 8 (ref 5–15)
BUN: 7 mg/dL (ref 6–20)
CO2: 28 mmol/L (ref 22–32)
Calcium: 9.5 mg/dL (ref 8.9–10.3)
Chloride: 105 mmol/L (ref 98–111)
Creatinine, Ser: 0.9 mg/dL (ref 0.61–1.24)
GFR, Estimated: >60 mL/min (ref >60)
Glucose, Bld: 95 mg/dL (ref 70–99)
Potassium: 3.6 mmol/L (ref 3.5–5.1)
Sodium: 141 mmol/L (ref 135–145)
Total Bilirubin: 1.4 mg/dL — ABNORMAL HIGH (ref 0.3–1.2)
Total Protein: 7.3 g/dL (ref 6.5–8.1)

## 2020-03-22 LAB — CBC
HCT: 46.3 % (ref 39.0–52.0)
Hemoglobin: 16.2 g/dL (ref 13.0–17.0)
MCH: 30.9 pg (ref 26.0–34.0)
MCHC: 35 g/dL (ref 30.0–36.0)
MCV: 88.2 fL (ref 80.0–100.0)
Platelets: 230 10*3/uL (ref 150–400)
RBC: 5.25 MIL/uL (ref 4.22–5.81)
RDW: 11.9 % (ref 11.5–15.5)
WBC: 9.2 10*3/uL (ref 4.0–10.5)
nRBC: 0 % (ref 0.0–0.2)

## 2020-03-22 LAB — LIPASE, BLOOD: Lipase: 53 U/L — ABNORMAL HIGH (ref 11–51)

## 2020-03-22 NOTE — ED Triage Notes (Signed)
Abdominal pain started yesterday with nausea, vomiting and diarrhea. Took pepto bismol without relief.

## 2020-03-23 ENCOUNTER — Emergency Department (HOSPITAL_COMMUNITY): Payer: Medicaid Other

## 2020-03-23 ENCOUNTER — Telehealth: Payer: Self-pay

## 2020-03-23 MED ORDER — IOHEXOL 300 MG/ML  SOLN
100.0000 mL | Freq: Once | INTRAMUSCULAR | Status: AC | PRN
  Administered 2020-03-23: 100 mL via INTRAVENOUS

## 2020-03-23 MED ORDER — BISMUTH SUBSALICYLATE 262 MG PO CHEW: 262.0000 mg | CHEWABLE_TABLET | ORAL | 0 refills | Status: DC | PRN

## 2020-03-23 MED ORDER — OMEPRAZOLE 40 MG PO CPDR: 40.0000 mg | DELAYED_RELEASE_CAPSULE | Freq: Every day | ORAL | 5 refills | Status: DC

## 2020-03-23 MED ORDER — LIDOCAINE VISCOUS HCL 2 % MT SOLN
15.0000 mL | Freq: Once | OROMUCOSAL | Status: AC
  Administered 2020-03-23: 15 mL via ORAL
  Filled 2020-03-23: qty 15

## 2020-03-23 MED ORDER — ALUM & MAG HYDROXIDE-SIMETH 200-200-20 MG/5ML PO SUSP
30.0000 mL | Freq: Once | ORAL | Status: AC
  Administered 2020-03-23: 30 mL via ORAL
  Filled 2020-03-23: qty 30

## 2020-03-23 MED ORDER — PANTOPRAZOLE SODIUM 40 MG IV SOLR
40.0000 mg | Freq: Once | INTRAVENOUS | Status: AC
  Administered 2020-03-23: 40 mg via INTRAVENOUS
  Filled 2020-03-23: qty 40

## 2020-03-23 NOTE — Telephone Encounter (Signed)
Called the interpreter line and they called the patient. Patient stated per interpreter that his case worker was supposed to schedule the procedure for the 17th with Dr.Mansouraty. I went over Dr.Mansouraty dates for an EGD with patient per interpreter. Patient stated he needed to be scheduled in April because he will be fasting for religous reasons in the month of may. Dr.Armbrustrer had 04/11/20 available and patient was scheduled for that day. Patient was currently at [facility] CT I told patient I would call back with further instructions.

## 2020-03-23 NOTE — Discharge Instructions (Signed)
Continue Pepto and Prilosec.  See the GI doctor as scheduled

## 2020-03-23 NOTE — ED Provider Notes (Signed)
[facility] EMERGENCY DEPARTMENT Provider Note   CSN: [id number] Arrival date & time: 03/22/20  2009     History Chief Complaint  Patient presents with  . Abdominal Pain    Oscar Hill is a 35 y.o. male.  Patient to ED with complaint of periumbilical and epigastric pain that has become severe. The pain radiates through to the back, and is made worse by eating any type of food. He states that liquids are now causing pain as well.  He has had pain for months and reports it is similar to pain he had when diagnosed with H. Pylori while living in [place]. He states his infection was treated and he had no pain until after moving to the [place] when it started again. He reports he is now having nausea without vomiting. He reports dark/black stool but is also taking Pepto Bismol on a regular basis. He does not drink alcohol.  He reports weight loss down from 85 kg that he weight in the recent past (today wt is 78.3), but cannot say over how long a time the weight loss has occurred.  He has been seen by primary care Austin Miles, Internal Med) and GI (Monsouraty). He has been prescribed Prilosec 40 mg BID, Pepcid, Carafate, and takes TUMs and Pepto OTC, but reports his pain is increasing. He is scheduled for EGD but is not sure when the procedure is to happen or where. There is a language barrier (Pashto) that has caused difficulty with his care in the [place]. No fever, chest pain, SOB, cough, congestion. He does report that deep breathing causes an increase in his abdominal pain.   The history is provided by the patient. A language interpreter was used.  Abdominal Pain Associated symptoms: nausea   Associated symptoms: no chills, no fever and no vomiting        Past Medical History:  Diagnosis Date  . GERD (gastroesophageal reflux disease)   . Helicobacter pylori gastritis     Patient Active Problem List   Diagnosis Date Noted  . 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 Epigastric  pain 02/09/2020  . Helicobacter pylori gastritis   . GERD (gastroesophageal reflux disease)     No past surgical history on file.     Family History  Problem Relation Age of Onset  . Healthy Mother   . Healthy Father     Social History   Tobacco Use  . Smoking status: Former Smoker    Types: Cigarettes  . Smokeless tobacco: Current User    Types: Snuff  . Tobacco comment: Used to smoke local tobacco in [place], now smokes 1 cigarette/day here.  Substance Use Topics  . Alcohol use: Never  . Drug use: Never    Home Medications Prior to Admission medications   Medication Sig Start Date End Date Taking? Authorizing Provider  bismuth subsalicylate (PEPTO-BISMOL) 262 MG/15ML suspension Take 30 mLs by mouth 3 (three) times daily as needed. 03/20/20 03/20/21  Dolan Amen, MD  calcium carbonate (TUMS - DOSED IN MG ELEMENTAL CALCIUM) 500 MG chewable tablet Chew 1 tablet (200 mg of elemental calcium total) by mouth 3 (three) times daily. 02/09/20   Merrilyn Puma, MD  docusate sodium (COLACE) 250 MG capsule Take 1 capsule (250 mg total) by mouth daily. Patient not taking: Reported on 03/09/2020 01/26/20   Rhys Martini, PA-C  hydrOXYzine (ATARAX/VISTARIL) 25 MG tablet Take 1 tablet (25 mg total) by mouth 3 (three) times daily. Patient not taking: Reported on 03/09/2020 02/09/20  Merrilyn Puma, MD  meclizine (ANTIVERT) 12.5 MG tablet Take 12.5 mg by mouth 3 (three) times daily. Patient not taking: Reported on 03/09/2020    [provider]  omeprazole (PRILOSEC) 40 MG capsule Take 1 capsule (40 mg total) by mouth daily. 03/09/20 06/07/20  Unk Lightning, PA  sertraline (ZOLOFT) 50 MG tablet Take 1 tablet (50 mg total) by mouth daily. 03/20/20 03/20/21  Dolan Amen, MD    Allergies    Patient has no known allergies.  Review of Systems   Review of Systems  Constitutional: Positive for unexpected weight change. Negative for appetite change, chills and fever.  HENT:  Negative.   Respiratory: Negative.        See HPI.  Cardiovascular: Negative.   Gastrointestinal: Positive for abdominal pain and nausea. Negative for vomiting.  Genitourinary: Negative for decreased urine volume.  Musculoskeletal: Negative.  Negative for myalgias.  Skin: Negative.  Negative for color change and pallor.  Neurological: Positive for headaches (Reports left sided headache associated with severe abdominal pain). Negative for weakness.    Physical Exam Updated Vital Signs BP 101/66 (BP Location: Left Arm)   Pulse (!) 47   Temp 97.6 F (36.4 C) (Oral)   Resp 18   SpO2 99%   Physical Exam Constitutional:      General: He is not in acute distress.    Appearance: He is well-developed. He is not ill-appearing.  HENT:     Head: Normocephalic.  Eyes:     Comments: No conjunctival pallor  Cardiovascular:     Rate and Rhythm: Normal rate and regular rhythm.  Pulmonary:     Effort: Pulmonary effort is normal.     Breath sounds: Normal breath sounds.  Abdominal:     General: Bowel sounds are normal. There is no distension.     Palpations: Abdomen is soft. There is no hepatomegaly, splenomegaly or mass.     Tenderness: There is abdominal tenderness in the epigastric area and periumbilical area. There is guarding. There is no rebound. Negative signs include Murphy's sign.  Musculoskeletal:        General: Normal range of motion.     Cervical back: Normal range of motion and neck supple.  Skin:    General: Skin is warm and dry.     Findings: No rash.  Neurological:     Mental Status: He is alert and oriented to person, place, and time.     ED Results / Procedures / Treatments   Labs (all labs ordered are listed, but only abnormal results are displayed) Labs Reviewed  LIPASE, BLOOD - Abnormal; Notable for the following components:      Result Value   Lipase 53 (*)    All other components within normal limits  COMPREHENSIVE METABOLIC PANEL - Abnormal; Notable for  the following components:   Total Bilirubin 1.4 (*)    All other components within normal limits  CBC  URINALYSIS, ROUTINE W REFLEX MICROSCOPIC   Results for orders placed or performed during the hospital encounter of 03/22/20  Lipase, blood  Result Value Ref Range   Lipase 53 (H) 11 - 51 U/L  Comprehensive metabolic panel  Result Value Ref Range   Sodium 141 135 - 145 mmol/L   Potassium 3.6 3.5 - 5.1 mmol/L   Chloride 105 98 - 111 mmol/L   CO2 28 22 - 32 mmol/L   Glucose, Bld 95 70 - 99 mg/dL   BUN 7 6 - 20 mg/dL  Creatinine, Ser 0.90 0.61 - 1.24 mg/dL   Calcium 9.5 8.9 - 01.0 mg/dL   Total Protein 7.3 6.5 - 8.1 g/dL   Albumin 4.2 3.5 - 5.0 g/dL   AST 19 15 - 41 U/L   ALT 16 0 - 44 U/L   Alkaline Phosphatase 88 38 - 126 U/L   Total Bilirubin 1.4 (H) 0.3 - 1.2 mg/dL   GFR, Estimated >93 >23 mL/min   Anion gap 8 5 - 15  CBC  Result Value Ref Range   WBC 9.2 4.0 - 10.5 K/uL   RBC 5.25 4.22 - 5.81 MIL/uL   Hemoglobin 16.2 13.0 - 17.0 g/dL   HCT 55.7 32.2 - 02.5 %   MCV 88.2 80.0 - 100.0 fL   MCH 30.9 26.0 - 34.0 pg   MCHC 35.0 30.0 - 36.0 g/dL   RDW 42.7 06.2 - 37.6 %   Platelets 230 150 - 400 K/uL   nRBC 0.0 0.0 - 0.2 %     EKG None  Radiology No results found.  Procedures Procedures   Medications Ordered in ED Medications  alum & mag hydroxide-simeth (MAALOX/MYLANTA) 200-200-20 MG/5ML suspension 30 mL (has no administration in time range)    And  lidocaine (XYLOCAINE) 2 % viscous mouth solution 15 mL (has no administration in time range)    ED Course  I have reviewed the triage vital signs and the nursing notes.  Pertinent labs & imaging results that were available during my care of the patient were reviewed by me and considered in my medical decision making (see chart for details).    MDM Rules/Calculators/A&P                          Patient to ED who speaks Pashto making detailed history difficult. Here for progressively worsening periumbilical  and epigastric abdominal pain over months, as detailed in the HPI.   He has a history of H. Pylori in the past and evaluations and treatment documented in his chart have been aimed at suspected recurrence. No imaging charted. He reports a 15 pound weight loss, is not getting relief from multiple medications. Scheduled for EGD but patient is unsure when it is or where he needs to go.   Will obtain CT abd/pel. Will call [facility] to confirm time and place of EGD study. GI cocktail ordered to see if this provides relief - will reassess for pain control No nausea currently.   Plan of care discussed with interpreter and patient understands and agrees with same. I spoke with Dr. Adela Lank who will try to confirm EGD details. Appreciate his help.   Patient care signed out to Langston Masker, PA-C, to review CT, reassess pain and determine appropriate disposition.   Per Dr. Adela Lank, the patient was to contact the office to schedule procedure hasn't. He will arrange for the office to contact him and schedule EGD.   Final Clinical Impression(s) / ED Diagnoses Final diagnoses:  None   1. Abdominal pain 2. Weight loss 3. History H. Pylori  Rx / DC Orders ED Discharge Orders    None       Elpidio Anis, PA-C 03/23/20 2831    Nira Conn, MD 03/23/20 [phone]

## 2020-03-29 ENCOUNTER — Other Ambulatory Visit: Payer: Self-pay | Admitting: Obstetrics and Gynecology

## 2020-03-29 ENCOUNTER — Ambulatory Visit
Admission: RE | Admit: 2020-03-29 | Discharge: 2020-03-29 | Disposition: A | Discharge status: Home / Self Care | Payer: No Typology Code available for payment source | Source: Ambulatory Visit | Attending: Obstetrics and Gynecology | Admitting: Obstetrics and Gynecology

## 2020-03-29 DIAGNOSIS — R7612 Nonspecific reaction to cell mediated immunity measurement of gamma interferon antigen response without active tuberculosis: Secondary | ICD-10-CM

## 2020-04-02 ENCOUNTER — Telehealth: Payer: Self-pay

## 2020-04-02 NOTE — Telephone Encounter (Signed)
Called patient with interpreter and asked if he could come by and pick up instructions and go over instructions. Patient stated he would come by tomorrow around 4:30pm

## 2020-04-06 NOTE — Progress Notes (Signed)
Internal Medicine Clinic Attending ? ?Case discussed with Dr. Winters  At the time of the visit.  We reviewed the resident?s history and exam and pertinent patient test results.  I agree with the assessment, diagnosis, and plan of care documented in the resident?s note.  ?

## 2020-04-09 ENCOUNTER — Telehealth: Payer: Self-pay | Admitting: Gastroenterology

## 2020-04-10 NOTE — Telephone Encounter (Signed)
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 Verlon Au had no other concerns at the end of the call.

## 2020-04-10 NOTE — Telephone Encounter (Signed)
I would proceed with EGD. 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 We should do the EGD, make sure nothing else going on, and will treat blastocystitis pending his course, but not convinced this would cause all of his problems. Thanks

## 2020-04-10 NOTE — Telephone Encounter (Signed)
Dr. Adela Lank, I have placed the results in your IN basket for review. Please advise if patient will be able to keep EGD appt on 04/11/20 as scheduled. Thanks

## 2020-04-11 ENCOUNTER — Encounter: Payer: Self-pay | Admitting: Gastroenterology

## 2020-04-11 ENCOUNTER — Ambulatory Visit (AMBULATORY_SURGERY_CENTER): Payer: Medicaid Other | Admitting: Gastroenterology

## 2020-04-11 ENCOUNTER — Other Ambulatory Visit: Payer: Self-pay

## 2020-04-11 VITALS — BP 92/62 | HR 55 | Temp 96.2°F | Resp 16 | Ht 68.0 in | Wt 173.0 lb

## 2020-04-11 DIAGNOSIS — K449 Diaphragmatic hernia without obstruction or gangrene: Secondary | ICD-10-CM

## 2020-04-11 DIAGNOSIS — R1013 Epigastric pain: Secondary | ICD-10-CM

## 2020-04-11 DIAGNOSIS — K219 Gastro-esophageal reflux disease without esophagitis: Secondary | ICD-10-CM | POA: Diagnosis not present

## 2020-04-11 HISTORY — PX: UPPER GI ENDOSCOPY: SHX6162

## 2020-04-11 MED ORDER — SODIUM CHLORIDE 0.9 % IV SOLN
500.0000 mL | Freq: Once | INTRAVENOUS | Status: DC
Start: 2020-04-11 — End: 2020-04-11

## 2020-04-11 NOTE — Patient Instructions (Signed)
   Await biopsy results done today  YOU HAD AN ENDOSCOPIC PROCEDURE TODAY AT THE [facility]:   Refer to the procedure report that was given to you for any specific questions about what was found during the examination.  If the procedure report does not answer your questions, please call your gastroenterologist to clarify.  If you requested that your care partner not be given the details of your procedure findings, then the procedure report has been included in a sealed envelope for you to review at your convenience later.  YOU SHOULD EXPECT: Some feelings of bloating in the abdomen. Passage of more gas than usual.  Walking can help get rid of the air that was put into your GI tract during the procedure and reduce the bloating. If you had a lower endoscopy (such as a colonoscopy or flexible sigmoidoscopy) you may notice spotting of blood in your stool or on the toilet paper. If you underwent a bowel prep for your procedure, you may not have a normal bowel movement for a few days.  Please Note:  You might notice some irritation and congestion in your nose or some drainage.  This is from the oxygen used during your procedure.  There is no need for concern and it should clear up in a day or so.  SYMPTOMS TO REPORT IMMEDIATELY:     Following upper endoscopy (EGD)  Vomiting of blood or coffee ground material  New chest pain or pain under the shoulder blades  Painful or persistently difficult swallowing  New shortness of breath  Fever of 100F or higher  Black, tarry-looking stools  For urgent or emergent issues, a gastroenterologist can be reached at any hour by calling [phone]. Do not use MyChart messaging for urgent concerns.    DIET:  We do recommend a small meal at first, but then you may proceed to your regular diet.  Drink plenty of fluids but you should avoid alcoholic beverages for 24 hours.  ACTIVITY:  You should plan to take it easy for the rest of today and you  should NOT DRIVE or use heavy machinery until tomorrow (because of the sedation medicines used during the test).    FOLLOW UP: Our staff will call the number listed on your records 48-72 hours following your procedure to check on you and address any questions or concerns that you may have regarding the information given to you following your procedure. If we do not reach you, we will leave a message.  We will attempt to reach you two times.  During this call, we will ask if you have developed any symptoms of COVID 19. If you develop any symptoms (ie: fever, flu-like symptoms, shortness of breath, cough etc.) before then, please call [phone].  If you test positive for Covid 19 in the 2 weeks post procedure, please call and report this information to [place].    If any biopsies were taken you will be contacted by phone or by letter within the next 1-3 weeks.  Please call us at [phone] if you have not heard about the biopsies in 3 weeks.    SIGNATURES/CONFIDENTIALITY: You and/or your care partner have signed paperwork which will be entered into your electronic medical record.  These signatures attest to the fact that that the information above on your After Visit Summary has been reviewed and is understood.  Full responsibility of the confidentiality of this discharge information lies with you and/or your care-partner.

## 2020-04-11 NOTE — Op Note (Signed)
[facility] Patient Name: Oscar FriedlanderMuhammad Aslin Procedure Date: 04/11/2020 1:19 PM MRN: [id number] Endoscopist: Viviann SpareSteven P. Adela LankArmbruster , MD Age: [age] Referring MD:  Date of Birth: 1985-10-24 Gender: Male Account #: [id number] Procedure:                Upper GI endoscopy Indications:              Epigastric abdominal pain, history of                            gastro-esophageal reflux disease - on omeprazole                            twice daily which has helped. History of H pylori.                            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 Medicines:                Monitored Anesthesia Care Procedure:                Pre-Anesthesia Assessment:                           - Prior to the procedure, a History and Physical                            was performed, and patient medications and                            allergies were reviewed. The patient's tolerance of                            previous anesthesia was also reviewed. The risks                            and benefits of the procedure and the sedation                            options and risks were discussed with the patient.                            All questions were answered, and informed consent                            was obtained. Prior Anticoagulants: The patient has                            taken no previous anticoagulant or antiplatelet                            agents. ASA Grade Assessment: II - A patient with                            mild systemic disease. After reviewing the risks  and benefits, the patient was deemed in                            satisfactory condition to undergo the procedure.                           After obtaining informed consent, the endoscope was                            passed under direct vision. Throughout the                            procedure, the patient's blood pressure, pulse, and                            oxygen  saturations were monitored continuously. The                            Endoscope was introduced through the mouth, and                            advanced to the second part of duodenum. The upper                            GI endoscopy was accomplished without difficulty.                            The patient tolerated the procedure well. Scope In: Scope Out: Findings:                 Esophagogastric landmarks were identified: the                            Z-line was found at 38 cm, the gastroesophageal                            junction was found at 38 cm and the upper extent of                            the gastric folds was found at 39 cm from the                            incisors.                           A 1 cm hiatal hernia was present.                           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 The exam of the  esophagus was otherwise normal.                           The entire examined stomach was normal. Biopsies                            were taken with a cold forceps for Helicobacter                            pylori testing.                           The duodenal bulb and second portion of the                            duodenum were normal. Biopsies for histology were                            taken with a cold forceps for evaluation of celiac                            disease. Complications:            No immediate complications. Estimated blood loss:                            Minimal. Estimated Blood Loss:     Estimated blood loss was minimal. Impression:               - Esophagogastric landmarks identified.                           - 1 cm hiatal hernia.                           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                           - Normal  stomach. Biopsied.                           - Normal duodenal bulb and second portion of the                            duodenum. Biopsied.                           No overt pathology noted to explain the patient's                            symptoms. No high risk pathology noted. Will await  biopsy results to ensure H pylori eradication and                            rule out enteropathy. 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 He                            is improved with omeprazole twice daily but not                            resolved. If biopsies negative, then would treat                            Blastocystitis and monitor response Recommendation:           - Patient has a contact number available for                            emergencies. The signs and symptoms of potential                            delayed complications were discussed with the                            patient. Return to normal activities tomorrow.                            Written discharge instructions were provided to the                            patient.                           - Resume previous diet.                           - Continue present medications.                           - Await pathology results. If biopsies negative                            will plan on treating Blastocystitis Teagen Bucio P. Tannen Vandezande, MD 04/11/2020 1:58:36 PM This report has been signed electronically.

## 2020-04-11 NOTE — Progress Notes (Signed)
Called to room to assist during endoscopic procedure.  Patient ID and intended procedure confirmed with present staff. Received instructions for my participation in the procedure from the performing physician.  

## 2020-04-11 NOTE — Progress Notes (Signed)
Report to PACU, RN, vss, BBS= Clear.  

## 2020-04-13 ENCOUNTER — Telehealth: Payer: Self-pay | Admitting: *Deleted

## 2020-04-13 NOTE — Telephone Encounter (Signed)
  Follow up Call-  Call back number 04/11/2020  Post procedure Call Back phone  # [phone] case manager Doha (pt does not speak english)  Permission to leave phone message Yes     Patient questions:  Do you have a fever, pain , or abdominal swelling? No. Pain Score  0 *  Have you tolerated food without any problems? Yes.    Have you been able to return to your normal activities? Yes.    Do you have any questions about your discharge instructions: Diet   No. Medications  No. Follow up visit  No.  Do you have questions or concerns about your Care? No.  Actions: * If pain score is 4 or above: No action needed, pain <4

## 2020-04-16 ENCOUNTER — Telehealth: Payer: Self-pay | Admitting: Gastroenterology

## 2020-04-16 NOTE — Telephone Encounter (Signed)
Spoke with patient, interpreter Noman, and also the case Production designer, theatre/television/film.   Patient reports that blood in stool and urine started the night of the procedure. Patient states that he was having constipation and straining, advised that it is possible that he has internal hemorrhoids that have become irritated. Advised patient to being taking Miralax to help soften stools, he is aware that this is over the counter. Pt reports heartburn and states that when he eats he feels nauseas. Advised patient that he will need to continue Omeprazole and take it about 30 minutes before eating, he is aware that he needs to continue Sucralfate as well. Reports headache and dizziness in the morning. Advised that patient follow up with PCP in regards to blood in urine, he will probably need a urinalysis. Answered everyone's questions. Patient and interpreter verbalized understanding and had no concerns at the end of the call.

## 2020-04-16 NOTE — Telephone Encounter (Signed)
Spoke with patient's interpreter Wallene Huh, he attempted to call patient but patient is working and did not answer. He reports that patient began having blood in stool and urine on Friday. He states that the omeprazole was the only thing working for the patient but now it doesn't seem to help. He states that patient reported a continued sore throat, nausea, and epigastric pain. He states that the patient is only able to drink water. Advised that patient may need to see PCP for blood in urine. Will await return call from patient and interpreter to further discuss any other concerns.

## 2020-04-16 NOTE — Telephone Encounter (Signed)
Patient interpreter and friend called in needing an urgent appointment for patient. Patient had a EDG procedure 04/11/20 and is now having blood in stool and urine, as well as continued epigastric pain. Can call interpreter back at [phone] or case manager Pearlean Brownie [phone]

## 2020-04-23 ENCOUNTER — Telehealth: Payer: Self-pay | Admitting: Gastroenterology

## 2020-04-23 NOTE — Telephone Encounter (Signed)
[place] can you help relay the following: Biopsy results are as outlined: Diagnosis 1. Surgical [P], duodenal - BENIGN DUODENAL MUCOSA - NO ACUTE INFLAMMATION, VILLOUS BLUNTING OR INCREASED INTRAEPITHELIAL LYMPHOCYTES IDENTIFIED 2. Surgical [P], gastric antrum and gastric body - BENIGN GASTRIC MUCOSA WITH FEATURES CONSISTENT WITH PROTON PUMP INHIBITOR EFFECT - NO H. PYLORI, INTESTINAL METAPLASIA OR MALIGNANCY IDENTIFIED  While they specifically did not test for TB I think that is unlikely on his biopsy results, no overt inflammation noted and biopsies don't show any significant inflammation. Intestinal TB in this light would be very unusual.  Can you otherwise contact the patient and let him know that biopsies are okay. He was on BID PPI and states it helped somewhat but his symptoms largely persisted. 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 It is controversial of this actually causes symptoms, however in the setting of symptoms we often treat it. Recommend flagyl 500mg  TID for one week and see if he feels any better on that regimen. If he does and symptoms resolve then I don't think he warrants any further follow up for this. If symptoms persist despite this course, however, he should let know. Of note, he does not speak [place] and will need a [place]. Thanks

## 2020-04-23 NOTE — Telephone Encounter (Signed)
Spoke with Deanna Artis at [facility] department, she wanted to know if patient's biopsies from his EGD were sent to be tested for intestinal TB. Deanna Artis states that the information was supposed to be relayed that patient had a positive Quantiferon Gold in 10/10/2019 and had a chest x-ray on 03/29/20 which was negative (did not show active disease) and he also had a CT scan (results are in epic). Advised that we only received a call report for the Blastocytitis and received the fax with those results. Deanna Artis is wanting to know if it would be too late to add on testing for intestinal TB to pathology that was sent out. Dr. Adela Lank, please advise. Thanks

## 2020-04-23 NOTE — Telephone Encounter (Signed)
Oscar Hill, [facility] Department, called about patient lab results. States patient usually test positive for TB. When he had biopsy done 4/6, did look for TB as well [phone]

## 2020-04-24 MED ORDER — METRONIDAZOLE 500 MG PO TABS: 500.0000 mg | ORAL_TABLET | Freq: Three times a day (TID) | ORAL | 0 refills | Status: AC

## 2020-04-24 NOTE — Telephone Encounter (Signed)
Spoke with Oscar Hill in regards to information from Dr. Adela Lank, she verbalized understanding and had no further concerns at the end of the call.  Spoke with patient via interpreter services (Interpreter [phone] - [place]) in regards to his pathology results and recommendations. Patient would [facility] [address] Patient wanted to know if we could send in a medication for his anxiety, advised that he will need to reach out to his PCP for that. Advised patient to call us back if his symptoms do not get better after taking the flagyl. Patient verbalized understanding and had no concerns at the end of the call.   Prescription sent to pharmacy on file.

## 2020-05-10 ENCOUNTER — Other Ambulatory Visit: Payer: Self-pay

## 2020-05-10 ENCOUNTER — Encounter (HOSPITAL_COMMUNITY): Payer: Self-pay

## 2020-05-10 ENCOUNTER — Emergency Department (HOSPITAL_COMMUNITY)
Admission: EM | Admit: 2020-05-10 | Discharge: 2020-05-11 | Disposition: A | Discharge status: Home / Self Care | Payer: Medicaid Other | Attending: Emergency Medicine | Admitting: Emergency Medicine

## 2020-05-10 DIAGNOSIS — Z87891 Personal history of nicotine dependence: Secondary | ICD-10-CM | POA: Diagnosis not present

## 2020-05-10 DIAGNOSIS — K219 Gastro-esophageal reflux disease without esophagitis: Secondary | ICD-10-CM | POA: Diagnosis not present

## 2020-05-10 DIAGNOSIS — K2900 Acute gastritis without bleeding: Secondary | ICD-10-CM | POA: Diagnosis not present

## 2020-05-10 DIAGNOSIS — R1013 Epigastric pain: Secondary | ICD-10-CM

## 2020-05-10 LAB — CBC WITH DIFFERENTIAL/PLATELET
Abs Immature Granulocytes: 0.01 10*3/uL (ref 0.00–0.07)
Basophils Absolute: 0.1 10*3/uL (ref 0.0–0.1)
Basophils Relative: 1 %
Eosinophils Absolute: 0.2 10*3/uL (ref 0.0–0.5)
Eosinophils Relative: 4 %
HCT: 48.3 % (ref 39.0–52.0)
Hemoglobin: 16.6 g/dL (ref 13.0–17.0)
Immature Granulocytes: 0 %
Lymphocytes Relative: 32 %
Lymphs Abs: 2.2 10*3/uL (ref 0.7–4.0)
MCH: 30.1 pg (ref 26.0–34.0)
MCHC: 34.4 g/dL (ref 30.0–36.0)
MCV: 87.7 fL (ref 80.0–100.0)
Monocytes Absolute: 0.5 10*3/uL (ref 0.1–1.0)
Monocytes Relative: 7 %
Neutro Abs: 3.9 10*3/uL (ref 1.7–7.7)
Neutrophils Relative %: 56 %
Platelets: 257 10*3/uL (ref 150–400)
RBC: 5.51 MIL/uL (ref 4.22–5.81)
RDW: 11.6 % (ref 11.5–15.5)
WBC: 6.9 10*3/uL (ref 4.0–10.5)
nRBC: 0 % (ref 0.0–0.2)

## 2020-05-10 LAB — URINALYSIS, ROUTINE W REFLEX MICROSCOPIC
Bacteria, UA: NONE SEEN
Bilirubin Urine: NEGATIVE
Glucose, UA: NEGATIVE mg/dL
Ketones, ur: NEGATIVE mg/dL
Leukocytes,Ua: NEGATIVE
Nitrite: NEGATIVE
Protein, ur: NEGATIVE mg/dL
Specific Gravity, Urine: 1.003 — ABNORMAL LOW (ref 1.005–1.030)
pH: 6 (ref 5.0–8.0)

## 2020-05-10 LAB — COMPREHENSIVE METABOLIC PANEL
ALT: 33 U/L (ref 0–44)
AST: 27 U/L (ref 15–41)
Albumin: 4.1 g/dL (ref 3.5–5.0)
Alkaline Phosphatase: 76 U/L (ref 38–126)
Anion gap: 6 (ref 5–15)
BUN: 9 mg/dL (ref 6–20)
CO2: 26 mmol/L (ref 22–32)
Calcium: 9 mg/dL (ref 8.9–10.3)
Chloride: 107 mmol/L (ref 98–111)
Creatinine, Ser: 0.97 mg/dL (ref 0.61–1.24)
GFR, Estimated: >60 mL/min (ref >60)
Glucose, Bld: 88 mg/dL (ref 70–99)
Potassium: 3.8 mmol/L (ref 3.5–5.1)
Sodium: 139 mmol/L (ref 135–145)
Total Bilirubin: 0.8 mg/dL (ref 0.3–1.2)
Total Protein: 6.9 g/dL (ref 6.5–8.1)

## 2020-05-10 NOTE — ED Provider Notes (Signed)
Emergency Medicine Provider Triage Evaluation Note  Omair Dettmer , a 35 y.o. male  was evaluated in triage.  Pt complains of weeakness, chest pain and abdominal pain  Review of Systems  Positive: Blurred vision Negative: Fever or cough  Physical Exam  Ht 5\' 8"  (1.727 m)   Wt 78.5 kg   BMI 26.31 kg/m  Gen:   Awake, no distress   Resp:  Normal effort  MSK:   Moves extremities without difficulty  Other:    Medical Decision Making  Medically screening exam initiated at 9:38 PM.  Appropriate orders placed.  Keelyn Fjelstad was informed that the remainder of the evaluation will be completed by another provider, this initial triage assessment does not replace that evaluation, and the importance of remaining in the ED until their evaluation is complete.     Cristy Friedlander 05/10/20 2158    2159, MD 05/22/20 [phone]

## 2020-05-10 NOTE — ED Triage Notes (Signed)
Multiple complaints - chest pain and back pain. Took medication (sertraline, omeprazole) this morning and since then felt fatigued and felt shaky.  Dx with celiac disease.

## 2020-05-11 MED ORDER — FAMOTIDINE 20 MG PO TABS: 20.0000 mg | ORAL_TABLET | Freq: Two times a day (BID) | ORAL | 0 refills | Status: DC

## 2020-05-11 MED ORDER — SUCRALFATE 1 G PO TABS
1.0000 g | ORAL_TABLET | Freq: Once | ORAL | Status: AC
  Administered 2020-05-11: 1 g via ORAL
  Filled 2020-05-11: qty 1

## 2020-05-11 MED ORDER — FAMOTIDINE 20 MG PO TABS
20.0000 mg | ORAL_TABLET | Freq: Once | ORAL | Status: AC
  Administered 2020-05-11: 20 mg via ORAL
  Filled 2020-05-11: qty 1

## 2020-05-11 MED ORDER — PANTOPRAZOLE SODIUM 40 MG PO TBEC
40.0000 mg | DELAYED_RELEASE_TABLET | Freq: Once | ORAL | Status: AC
  Administered 2020-05-11: 40 mg via ORAL
  Filled 2020-05-11: qty 1

## 2020-05-11 NOTE — ED Notes (Signed)
Md Mesner spoke to pt. Answered all pts questions. Pt instructed to get dressed and provided discharge instructions.

## 2020-05-11 NOTE — ED Notes (Signed)
Pt was provided discharge papers and informed about his diagnosis. Pt reported wanting to speak to the doctor prior to discharge. RN informed md Mesner.

## 2020-05-11 NOTE — ED Provider Notes (Signed)
[facility] EMERGENCY DEPARTMENT Provider Note   CSN: [id number] Arrival date & time: 05/10/20  2122     History Chief Complaint  Patient presents with  . multiple complaints    Oscar Hill is a 35 y.o. male.  Using interpreter it appears that the patient's main complaint is that he has epigastric burning related to eating.  Patient states that this has been going on worse for last few days.  He has seen a GI doctor in the past which did not seem to help him.  He wants me to fix his.  He states his symptoms he will burp up some acidic tasting stuff as well.  Sometimes makes him feel nauseous.  States compliance with his PPI.  No other medications for the same        Past Medical History:  Diagnosis Date  . Anxiety   . Depression   . GERD (gastroesophageal reflux disease)   . Helicobacter pylori gastritis     Patient Active Problem List   Diagnosis Date Noted  . 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 Epigastric pain 02/09/2020  . Helicobacter pylori gastritis   . GERD (gastroesophageal reflux disease)     Past Surgical History:  Procedure Laterality Date  . UPPER GI ENDOSCOPY  04/11/2020       Family History  Problem Relation Age of Onset  . Healthy Mother   . Healthy Father     Social History   Tobacco Use  . Smoking status: Former Smoker    Types: Cigarettes  . Smokeless tobacco: Current User    Types: Snuff  . Tobacco comment: Used to smoke local tobacco in [place], now smokes 1 cigarette/day here.  Substance Use Topics  . Alcohol use: Never  . Drug use: Never    Home Medications Prior to Admission medications   Medication Sig Start Date End Date Taking? Authorizing Provider  famotidine (PEPCID) 20 MG tablet Take 1 tablet (20 mg total) by mouth 2 (two) times daily. 05/11/20  Yes Ladine Kiper, Barbara Cower, MD  bismuth subsalicylate (PEPTO BISMOL) 262 MG chewable tablet Chew 1 tablet (262 mg total) by mouth as needed for indigestion or  diarrhea or loose stools. 03/23/20   Elson Areas, PA-C  ibuprofen (ADVIL) 200 MG tablet Take 200 mg by mouth every 6 (six) hours as needed for headache or moderate pain.    [provider]  omeprazole (PRILOSEC) 40 MG capsule Take 1 capsule (40 mg total) by mouth daily. 03/23/20 06/21/20  Elson Areas, PA-C  sucralfate (CARAFATE) 1 g tablet Take 1 g by mouth 4 (four) times daily -  with meals and at bedtime.    [provider]    Allergies    Patient has no known allergies.  Review of Systems   Review of Systems  All other systems reviewed and are negative.   Physical Exam Updated Vital Signs BP (!) 123/92   Pulse (!) 54   Temp (!) 97.5 F (36.4 C)   Resp 14   Ht 5\' 8"  (1.727 m)   Wt 78.5 kg   SpO2 100%   BMI 26.31 kg/m   Physical Exam Vitals and nursing note reviewed.  Constitutional:      Appearance: He is well-developed.  HENT:     Head: Normocephalic and atraumatic.     Mouth/Throat:     Mouth: Mucous membranes are moist.     Pharynx: Oropharynx is clear.  Eyes:     Pupils: Pupils are  equal, round, and reactive to light.  Cardiovascular:     Rate and Rhythm: Normal rate.  Pulmonary:     Effort: Pulmonary effort is normal. No respiratory distress.  Abdominal:     General: Abdomen is flat. There is no distension.  Musculoskeletal:        General: No swelling or tenderness. Normal range of motion.     Cervical back: Normal range of motion.  Skin:    General: Skin is warm and dry.  Neurological:     General: No focal deficit present.     Mental Status: He is alert.     ED Results / Procedures / Treatments   Labs (all labs ordered are listed, but only abnormal results are displayed) Labs Reviewed  URINALYSIS, ROUTINE W REFLEX MICROSCOPIC - Abnormal; Notable for the following components:      Result Value   Color, Urine STRAW (*)    Specific Gravity, Urine 1.003 (*)    Hgb urine dipstick SMALL (*)    All other components within  normal limits  CBC WITH DIFFERENTIAL/PLATELET  COMPREHENSIVE METABOLIC PANEL    EKG None  Radiology No results found.  Procedures Procedures   Medications Ordered in ED Medications  famotidine (PEPCID) tablet 20 mg (20 mg Oral Given 05/11/20 0640)  pantoprazole (PROTONIX) EC tablet 40 mg (40 mg Oral Given 05/11/20 0640)  sucralfate (CARAFATE) tablet 1 g (1 g Oral Given 05/11/20 1157)    ED Course  I have reviewed the triage vital signs and the nursing notes.  Pertinent labs & imaging results that were available during my care of the patient were reviewed by me and considered in my medical decision making (see chart for details).    MDM Rules/Calculators/A&P                          Suspect worsening GERD. Will treat for same. Needs GI follow up, message sent. Will add on pepcid.   Final Clinical Impression(s) / ED Diagnoses Final diagnoses:  Epigastric pain  Acute gastritis without hemorrhage, unspecified gastritis type    Rx / DC Orders ED Discharge Orders         Ordered    famotidine (PEPCID) 20 MG tablet  2 times daily        05/11/20 0614           Kaevon Cotta, Barbara Cower, MD 05/12/20 2620

## 2020-05-11 NOTE — Discharge Instructions (Signed)
I think your symptoms are from gastritis. this is an inflammation of your stomach. i have prescribed you new medication to help. Please follow up with Dr. Adela Lank with gastroenterology to ensure things are getting better and to get help if they are not. Avoid alcohol, tobaccol, spicy foods and NSAIDs.   ?? ??? ??? ?? ????? ??? ? ??????? ??? ??. ?? ????? ? ???? ?????? ??. ?? ???? ?? ? ????? ????? ??? ???? ?????. ??????? ???? ? ???? ??? ? ????? ???????? ??? ????? ??? ???? ??? ?????? ??? ?? ???? ?? ???? ?? ?? ?? ?? ????? ?????? ???. ? ?????? ??????? ????? ?????? ????? ?? NSAIDs ??? ??? ????.

## 2020-05-14 ENCOUNTER — Telehealth: Payer: Self-pay

## 2020-05-14 NOTE — Telephone Encounter (Signed)
RN called to waiting room d/t patient presenting in person w/ questions.  However, patient does not speak any [place], he speaks Pashto.  Patient brought back to an exam room and computer translator called [phone] to assist.  Pt states he was in the ED a few days ago for abdominal pain and want an appt to f/u.  There are no openings today or tomorrow.  Pt will need a longer appt d/t language barrier so office manager called, pt given a 90 minute appt on Thursday, 05/17/20 @ 2:15 with Dr. Cyndie Chime.  Pt asking for a work note excuse for this week.  RN informed patient since Dr. Cyndie Chime has never seen him, he will want to discuss this with him at his upcoming appt.  Pt denies any further needs. SChaplin, RN,BSN

## 2020-05-17 ENCOUNTER — Encounter: Payer: Self-pay | Admitting: Student

## 2020-05-17 ENCOUNTER — Ambulatory Visit: Payer: Medicaid Other | Admitting: Student

## 2020-05-17 VITALS — BP 119/80 | HR 64 | Temp 97.8°F | Ht 72.0 in | Wt 164.9 lb

## 2020-05-17 DIAGNOSIS — K219 Gastro-esophageal reflux disease without esophagitis: Secondary | ICD-10-CM

## 2020-05-17 DIAGNOSIS — R1013 Epigastric pain: Secondary | ICD-10-CM

## 2020-05-17 DIAGNOSIS — R310 Gross hematuria: Secondary | ICD-10-CM

## 2020-05-17 DIAGNOSIS — R3129 Other microscopic hematuria: Secondary | ICD-10-CM

## 2020-05-17 DIAGNOSIS — Z0000 Encounter for general adult medical examination without abnormal findings: Secondary | ICD-10-CM

## 2020-05-17 DIAGNOSIS — R319 Hematuria, unspecified: Secondary | ICD-10-CM | POA: Insufficient documentation

## 2020-05-17 MED ORDER — FAMOTIDINE 20 MG PO TABS: 20.0000 mg | ORAL_TABLET | Freq: Two times a day (BID) | ORAL | 0 refills | Status: DC

## 2020-05-17 MED ORDER — ESOMEPRAZOLE MAGNESIUM 20 MG PO CPDR: 20.0000 mg | DELAYED_RELEASE_CAPSULE | Freq: Two times a day (BID) | ORAL | 2 refills | Status: DC

## 2020-05-17 NOTE — Progress Notes (Signed)
   CC: Epigastric pain  HPI:  [name] is a 35 y.o. past medical history of GERD who presents to the clinic for epigastric pain, nausea and vomiting.  Please see problem based charting for further detailed  Past Medical History:  Diagnosis Date  . Anxiety   . Depression   . GERD (gastroesophageal reflux disease)   . Helicobacter pylori gastritis    Review of Systems: As per HPI  Physical Exam:  Vitals:   05/17/20 1443  BP: 119/80  Pulse: 64  Temp: 97.8 F (36.6 C)  TempSrc: Oral  SpO2: 97%  Weight: 164 lb 14.4 oz (74.8 kg)  Height: 6' (1.829 m)   Physical Exam Constitutional:      General: He is not in acute distress.    Appearance: He is not toxic-appearing.  HENT:     Head: Normocephalic.  Eyes:     General: No scleral icterus.       Right eye: No discharge.        Left eye: No discharge.     Conjunctiva/sclera: Conjunctivae normal.  Cardiovascular:     Rate and Rhythm: Normal rate and regular rhythm.     Heart sounds: Normal heart sounds.  Pulmonary:     Effort: Pulmonary effort is normal. No respiratory distress.     Breath sounds: Normal breath sounds. No wheezing.  Abdominal:     General: Bowel sounds are normal.     Comments: Tenderness to palpation of epigastric region.  No right upper quadrant pain.  Negative Murphy sign.  No distention.  Consistent percussion throughout.  Musculoskeletal:        General: Normal range of motion.  Skin:    General: Skin is warm.  Neurological:     Mental Status: He is alert.  Psychiatric:        Mood and Affect: Mood normal.     Assessment & Plan:   See Encounters Tab for problem based charting.  Patient discussed with Dr. Mayford Knife

## 2020-05-17 NOTE — Assessment & Plan Note (Signed)
-   HIV and hep C today

## 2020-05-17 NOTE — Assessment & Plan Note (Addendum)
Patient states that he has had this problem for a year in [place] and has gotten worse after 6 months in the [place].  States that he was treated for H. pylori and confirmed eradication.  States the pain happen with and without eating, describes pain as aching and severe.  Triggered with food and water.  Pain usually happen 10 minutes after food.  Pain located in the epigastric region.  States that he used to take esomeprazole which helped.  Patient endorses constipation, nausea and vomiting, acid reflux and food regurgitation.  Currently taking omeprazole 40 mg and Tums as needed.   Patient showed the medication that he was taking in [place] which include omeprazole, lepride, droperidol and cinnarizine.  Patient states that his diet has been significantly different when he moved to the [place].  States that the bread in the last day is so different.  He denies eating any spicy food.  Patient has had this problem for a long time.  He was seen by GI and has an endoscopy in April which was negative for H. pylori, malignancy or ulcers.  However his stool was positive for Blastocystis and was treated with Flagyl for 1 week.    Assessment and plan Unclear cause of his symptoms at this time.  Suspect GERD with stomach irritation from tight differences.  However I am concerned about his weight loss (he was down 11 pounds in 1 week).    -Will start his omeprazole and Pepcid -Advised patient to follow-up with GI for other recommendations -Will obtain ova and parasite stool test to rule out other parasitic infection.  His eosinophils however it within normal limits.  Addendum Patient has made a follow-up appointment with GI in June. Stool test is negative for ova, cysts or parasites.  Can not reach patient via phone using interpreter service. Unable to leave voicemail

## 2020-05-17 NOTE — Patient Instructions (Addendum)
Mr. Oscar Hill,   ?  ~??~ ~?   ? ? .    ? ?? ? ? ? ??  ? ? ~??:  1. GERD:      esomeprazole  Pepcid  ??.    ?  ? ~   ? ?  ~?.  ~? Z  Z   GI ?~?  ? ~?  ?  ~?.  [facility] [phone]  2.   ~? ?:       . ~ ?? ~ ?~       ? ~?.    It was a pleasure seeing you in the clinic today.  Here is a summary of what we talked about:  1.  GERD: I will send a prescription of esomeprazole and Pepcid to the pharmacy.  I will obtain a stool test to check for parasite.  Please call and make a follow-up appointment with your GI doctor as soon as possible.  [facility] [phone]  2.  Blood in urine: I will obtain urine sample today.  If there is any infection, I will call you.  Take care,  Dr. Cyndie Chime

## 2020-05-17 NOTE — Assessment & Plan Note (Addendum)
Patient reports having gross hematuria 5 days after his EGD and has resolved.  He reports persistent dysuria, and chills.  Denies flank pain.  Patient reports issue with erection for 4 months.  His last intercourse was 8 months.  Reports monogamous relationship.  His wife is currently in [place].  He denies any penile discharge.  Assessment and plan -Will obtain UA and urine culture to rule out UTI.  Treatment accordingly  Addendum UA was negative for hematuria or infection.

## 2020-05-18 LAB — URINALYSIS, ROUTINE W REFLEX MICROSCOPIC
Bilirubin, UA: NEGATIVE
Glucose, UA: NEGATIVE
Ketones, UA: NEGATIVE
Leukocytes,UA: NEGATIVE
Nitrite, UA: NEGATIVE
Protein,UA: NEGATIVE
RBC, UA: NEGATIVE
Specific Gravity, UA: 1.011 (ref 1.005–1.030)
Urobilinogen, Ur: 0.2 mg/dL (ref 0.2–1.0)
pH, UA: 6.5 (ref 5.0–7.5)

## 2020-05-18 LAB — HEPATITIS C ANTIBODY: Hep C Virus Ab: <0.1 s/co ratio (ref 0.0–0.9)

## 2020-05-18 LAB — HIV ANTIBODY (ROUTINE TESTING W REFLEX): HIV Screen 4th Generation wRfx: NONREACTIVE

## 2020-05-19 LAB — URINE CULTURE: Organism ID, Bacteria: NO GROWTH

## 2020-05-20 NOTE — Progress Notes (Signed)
Internal Medicine Clinic Attending  Case discussed with Dr. Nguyen  At the time of the visit.  We reviewed the resident's history and exam and pertinent patient test results.  I agree with the assessment, diagnosis, and plan of care documented in the resident's note. 

## 2020-05-23 ENCOUNTER — Other Ambulatory Visit: Payer: Self-pay | Admitting: *Deleted

## 2020-05-23 DIAGNOSIS — K219 Gastro-esophageal reflux disease without esophagitis: Secondary | ICD-10-CM

## 2020-05-23 MED ORDER — ESOMEPRAZOLE MAGNESIUM 20 MG PO CPDR: 20.0000 mg | DELAYED_RELEASE_CAPSULE | Freq: Two times a day (BID) | ORAL | 2 refills | Status: DC

## 2020-05-25 ENCOUNTER — Other Ambulatory Visit: Payer: Medicaid Other

## 2020-05-25 ENCOUNTER — Other Ambulatory Visit: Payer: Self-pay

## 2020-05-25 DIAGNOSIS — K219 Gastro-esophageal reflux disease without esophagitis: Secondary | ICD-10-CM

## 2020-05-31 ENCOUNTER — Other Ambulatory Visit: Payer: Self-pay | Admitting: Student

## 2020-05-31 DIAGNOSIS — K219 Gastro-esophageal reflux disease without esophagitis: Secondary | ICD-10-CM

## 2020-05-31 LAB — OVA AND PARASITE EXAMINATION

## 2020-06-10 ENCOUNTER — Other Ambulatory Visit: Payer: Self-pay | Admitting: Student

## 2020-06-10 DIAGNOSIS — K219 Gastro-esophageal reflux disease without esophagitis: Secondary | ICD-10-CM

## 2020-06-29 ENCOUNTER — Encounter: Payer: Self-pay | Admitting: Gastroenterology

## 2020-06-29 ENCOUNTER — Other Ambulatory Visit: Payer: Self-pay | Admitting: Gastroenterology

## 2020-06-29 ENCOUNTER — Ambulatory Visit (INDEPENDENT_AMBULATORY_CARE_PROVIDER_SITE_OTHER): Payer: Self-pay | Admitting: Gastroenterology

## 2020-06-29 ENCOUNTER — Other Ambulatory Visit: Payer: Self-pay

## 2020-06-29 VITALS — BP 100/70 | HR 66 | Ht 69.0 in | Wt 161.0 lb

## 2020-06-29 DIAGNOSIS — K5900 Constipation, unspecified: Secondary | ICD-10-CM

## 2020-06-29 DIAGNOSIS — R1013 Epigastric pain: Secondary | ICD-10-CM

## 2020-06-29 DIAGNOSIS — R110 Nausea: Secondary | ICD-10-CM

## 2020-06-29 MED ORDER — POLYETHYLENE GLYCOL 3350 17 G PO PACK: 17.0000 g | PACK | Freq: Every day | ORAL | 0 refills | Status: DC

## 2020-06-29 MED ORDER — OMEPRAZOLE 40 MG PO CPDR: 1.0000 | DELAYED_RELEASE_CAPSULE | Freq: Every day | ORAL | 1 refills | Status: DC

## 2020-06-29 MED ORDER — FDGARD 25-20.75 MG PO CAPS: ORAL_CAPSULE | ORAL | Status: DC

## 2020-06-29 MED ORDER — ONDANSETRON 4 MG PO TBDP: 4.0000 mg | ORAL_TABLET | Freq: Three times a day (TID) | ORAL | 1 refills | Status: DC | PRN

## 2020-06-29 NOTE — Telephone Encounter (Signed)
Duplicate refill

## 2020-06-29 NOTE — Patient Instructions (Addendum)
If you are age 37 or older, your body mass index should be between 23-30. Your Body mass index is 23.78 kg/m. If this is out of the aforementioned range listed, please consider follow up with your Primary Care Provider.  If you are age 62 or younger, your body mass index should be between 19-25. Your Body mass index is 23.78 kg/m. If this is out of the aformentioned range listed, please consider follow up with your Primary Care Provider.   __________________________________________________________  The [facility] providers would like to encourage you to use [facility] to communicate with providers for non-urgent requests or questions.  Due to long hold times on the telephone, sending your provider a message by [facility] may be a faster and more efficient way to get a response.  Please allow 48 business hours for a response.  Please remember that this is for non-urgent requests.  __________________________________________________________  Stop nexium. Take omeprazole 40 mg once daily INSTEAD of Nexium  Stop ibuprofen, use Tylenol instead.  Take Miralax as directed, over-the-counter daily. We are giving you samples and a coupon today.  You will be contacted by [facility] Scheduling in the next 2 days to arrange a RUQ ultrasound to rule out gallstones.  The number on your caller ID will be [phone], please answer when they call.  If you have not heard from them in 2 days please call [phone] to schedule.    We have sent the following medications to your pharmacy for you to pick up at your convenience: Zofran 4 mg ODT: dissolve 1 tablet orally every 8 hours as needed for nausea   You have been scheduled for a follow up appointment on Wednesday, 8-24 at 8:30am    Thank you for entrusting me with your care and for choosing [facility], Dr. Ileene Patrick

## 2020-06-29 NOTE — Progress Notes (Signed)
HPI :  35 year old male here for a follow-up visit for dyspepsia, upper abdominal pain.  Recall he is from [place] and does not speak any [place].  He is accompanied by an interpreter today.  He was initially seen by Hyacinth Meeker in March 2022.  Recall at that time he endorsed longstanding history of abdominal complaints, had a history of ulcer and H. pylori when in [place].  Had been on PPI and Pepcid with some improvement of his symptoms.  Back in February he endorsed some melena.  He had been treated for H. pylori in [place] prior to coming to the [place].  He underwent an upper endoscopy with me on April 6.  Remarkable findings were 1 cm hiatal hernia.  His stomach was normal, biopsies for H. pylori eradication showed no evidence of persistent H. pylori.  Small bowel is normal, biopsies were normal as well.  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  We had discussed that it is controversial whether or not this is pathogenic but in light of his symptoms we treated him with a course of Flagyl.  He states it may have helped for a period of time but symptoms largely persist.  He had a follow-up stool study on May 20 that was negative for any persistent Blastocystis.  Recall that he had a CT scan in March of this year that was normal.  He continues to have some postprandial upper abdominal discomfort that bothers him.  He is not eating well because of this discomfort, feels bad when he eats.  Has some nausea, does not sound like he vomits much however.  He has been taking Advil for knee pains.  He works 12-hour shifts at [employer], states he takes 2 of these every day to allow him to function.  It appears he has been taking omeprazole 40 mg a day as well as Nexium 20 mg/day, taking both every day.  He states the omeprazole seems to help more than the Nexium.  I asked him if he has had any major dietary changes since  moving to our country and he has.  He has been staying on mostly a bland diet as foods tend to upset his stomach.  Sounds like he drinks a lot of milk, eats fruits but avoids apples.  Drinks a lot of water.  He does endorse some baseline constipation that has bothered him, 1 bowel movement per day but is hard to get things out.  No blood in his stools.  Denies any diarrhea.  He is anxious about the symptoms and that they are persisting over time.  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    EGD 04/11/20 -  - A 1 cm hiatal hernia was present. 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 The exam of the esophagus was otherwise normal. - The entire examined stomach was normal. Biopsies were taken with a cold forceps for Helicobacter pylori testing. - The duodenal bulb and second portion of the duodenum were normal. Biopsies for histology were taken with a cold forceps for evaluation of celiac disease.   1. Surgical [P], duodenal - BENIGN DUODENAL MUCOSA - NO ACUTE INFLAMMATION, VILLOUS BLUNTING OR INCREASED INTRAEPITHELIAL LYMPHOCYTES IDENTIFIED 2. Surgical [P], gastric  antrum and gastric body - BENIGN GASTRIC MUCOSA WITH FEATURES CONSISTENT WITH PROTON PUMP INHIBITOR EFFECT - NO H. PYLORI, INTESTINAL METAPLASIA OR MALIGNANCY IDENTIFIED  Stool ova / parasite 05/25/20 - negative  CT scan abdomen pelvis with contrast 03/23/20 - negative      Past Medical History:  Diagnosis Date   Anxiety    Depression    GERD (gastroesophageal reflux disease)    Helicobacter pylori gastritis      Past Surgical History:  Procedure Laterality Date   UPPER GI ENDOSCOPY  04/11/2020   Family History  Problem Relation Age of Onset   Healthy Mother    Healthy Father    Colon cancer Neg Hx    Pancreatic cancer Neg Hx    Esophageal  cancer Neg Hx    Liver cancer Neg Hx    Stomach cancer Neg Hx    Social History   Tobacco Use   Smoking status: Former    Pack years: 0.00    Types: Cigarettes   Smokeless tobacco: Current    Types: Snuff   Tobacco comments:    Used to smoke local tobacco in [place], now smokes 1 cigarette/day here.  Substance Use Topics   Alcohol use: Never   Drug use: Never   Current Outpatient Medications  Medication Sig Dispense Refill   bismuth subsalicylate (PEPTO BISMOL) 262 MG chewable tablet Chew 1 tablet (262 mg total) by mouth as needed for indigestion or diarrhea or loose stools. 30 tablet 0   esomeprazole (NEXIUM) 20 MG capsule TAKE 1 CAPSULE (20 MG TOTAL) BY MOUTH 2 (TWO) TIMES DAILY BEFORE A MEAL. 180 capsule 0   ibuprofen (ADVIL) 200 MG tablet Take 200 mg by mouth every 6 (six) hours as needed for headache or moderate pain.     omeprazole (PRILOSEC) 40 MG capsule Take 1 capsule by mouth daily.     No current facility-administered medications for this visit.   No Known Allergies   Review of Systems: All systems reviewed and negative except where noted in HPI.   Lab Results  Component Value Date   WBC 6.9 05/10/2020   HGB 16.6 05/10/2020   HCT 48.3 05/10/2020   MCV 87.7 05/10/2020   PLT 257 05/10/2020   Lab Results  Component Value Date   ALT 33 05/10/2020   AST 27 05/10/2020   ALKPHOS 76 05/10/2020   BILITOT 0.8 05/10/2020    Lab Results  Component Value Date   CREATININE 0.97 05/10/2020   BUN 9 05/10/2020   NA 139 05/10/2020   K 3.8 05/10/2020   CL 107 05/10/2020   CO2 26 05/10/2020     Physical Exam: BP 100/70   Pulse 66   Ht 5\' 9"  (1.753 m)   Wt 161 lb (73 kg)   SpO2 97%   BMI 23.78 kg/m  Constitutional: Pleasant,well-developed,male in no acute distress. Abdominal: Soft, nondistended, nontender.  There are no masses palpable.  Extremities: no edema Lymphadenopathy: No cervical adenopathy noted. Neurological: Alert and oriented to person  place and time. Skin: Skin is warm and dry. No rashes noted. Psychiatric: Normal mood and affect. Behavior is normal.   ASSESSMENT AND PLAN: 35 year old male here for reassessment of the following:  Epigastric pain Dyspepsia Nausea Constipation  History as outlined above.  Ongoing intermittent upper abdominal pain, appears to have a postprandial component to this.  CT scan negative.  Labs negative.  EGD negative with biopsies of stomach negative for H. pylori.  He had a stool test  that was positive for Blastocystis, unclear if this is truly pathogenic or not, he was treated for it and it was eradicated, yet his symptoms persist so I suspect that was a red herring.  We discussed the situation.  I will screen him for gallstones to rule out biliary colic and order a right upper quadrant ultrasound.  If that is negative I suspect he probably has a largely functional symptoms, unclear if this is dietary related given his moved to the [place].  He is taking ibuprofen routinely and recommend he stop that and use Tylenol instead.  We wrote this out for him so he can purchase this at the pharmacy, as he does not speak much [place].  He is taking 2 PPIs, he does think they help and will stop the Nexium as he thinks omeprazole helps more, we will refill omeprazole 40 mg a day.  I provided him some Zofran to use as needed for his nausea and see if that will help settle his stomach.  I also gave him some free samples of FD guard to treat component of dyspepsia and see if that helps.  Recommend MiraLAX once daily and titrate up as needed for constipation and see if that helps.  We will follow him up to make sure he continues to improve, scheduled for follow-up in a few months but he can call sooner with any issues.  If his symptoms persist despite these measures, we may consider buspirone to treat his anxiety and for functional dyspepsia.  Several minutes spent with the patient and the translator today,  reassured him against anything concerning we found so far.  Plan: - RUQ [place] rule out gallstones - stop ibuprofen, use tylenol instead - stop nexium - refill omeprazole 40mg  / day - trial of Zofran 4mg  OTDT every 8 hjours PRN #30 RF1 for nausea - FD gard samples - prior to meals - Miralax OTC daily - samples of it - F/u appointment in a few months - note for work today - consideration for buspirone if symptoms persist, treatment for functional dyspepsia  , MD [facility]

## 2020-07-11 ENCOUNTER — Other Ambulatory Visit: Payer: Self-pay

## 2020-07-11 ENCOUNTER — Ambulatory Visit (HOSPITAL_COMMUNITY)
Admission: RE | Admit: 2020-07-11 | Discharge: 2020-07-11 | Disposition: A | Discharge status: Home / Self Care | Payer: BC Managed Care – PPO | Source: Ambulatory Visit | Attending: Gastroenterology | Admitting: Gastroenterology

## 2020-07-11 DIAGNOSIS — R1013 Epigastric pain: Secondary | ICD-10-CM | POA: Diagnosis not present

## 2020-07-11 DIAGNOSIS — K5900 Constipation, unspecified: Secondary | ICD-10-CM | POA: Insufficient documentation

## 2020-07-11 DIAGNOSIS — R110 Nausea: Secondary | ICD-10-CM | POA: Diagnosis present

## 2020-07-25 ENCOUNTER — Other Ambulatory Visit: Payer: Self-pay

## 2020-07-25 ENCOUNTER — Ambulatory Visit (HOSPITAL_COMMUNITY)
Admission: EM | Admit: 2020-07-25 | Discharge: 2020-07-25 | Disposition: A | Discharge status: Home / Self Care | Payer: BC Managed Care – PPO | Attending: Internal Medicine | Admitting: Internal Medicine

## 2020-07-25 ENCOUNTER — Encounter (HOSPITAL_COMMUNITY): Payer: Self-pay | Admitting: *Deleted

## 2020-07-25 DIAGNOSIS — S39012A Strain of muscle, fascia and tendon of lower back, initial encounter: Secondary | ICD-10-CM | POA: Diagnosis not present

## 2020-07-25 DIAGNOSIS — K4090 Unilateral inguinal hernia, without obstruction or gangrene, not specified as recurrent: Secondary | ICD-10-CM

## 2020-07-25 MED ORDER — IBUPROFEN 800 MG PO TABS: 800.0000 mg | ORAL_TABLET | Freq: Three times a day (TID) | ORAL | 0 refills | Status: DC

## 2020-07-25 NOTE — ED Provider Notes (Signed)
[facility]    CSN: [id number] Arrival date & time: 07/25/20  1615      History   Chief Complaint Chief Complaint  Patient presents with   Abdominal Pain   bladder pain    HPI Oscar Hill is a 35 y.o. male who presents saying his employer sent him here to have an ultrasound on his groin to check for hernia. 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 He has noticed swelling on his L groin which is new and never had this swelling before or pain on this area. The pain is described as intermittent and only bothers him when he walks. His L back pain is constant and gets worse with spine movement and walking. Denies pain radiating to his L leg. He went to [facility] ER on 7/10 where he states he was not examined and was sent to do CT, blood work and checked a urine and told he had constipation. He did not receive a hernia exam.  " They did not touch me"     Past Medical History:  Diagnosis Date   Anxiety    Depression    GERD (gastroesophageal reflux disease)    Helicobacter pylori gastritis     Patient Active Problem List   Diagnosis Date Noted   Hematuria 05/17/2020   Healthcare maintenance 05/17/2020   Situational depression 03/21/2020   Epigastric pain 02/09/2020    Past Surgical History:  Procedure Laterality Date   UPPER GI ENDOSCOPY  04/11/2020       Home Medications    Prior to Admission medications   Medication Sig Start Date End Date Taking? Authorizing Provider  ibuprofen (ADVIL) 800 MG tablet Take 1 tablet (800 mg total) by mouth 3 (three) times daily. 07/25/20  Yes Rodriguez-Southworth, Nettie Elm, PA-C  bismuth subsalicylate (PEPTO BISMOL) 262 MG chewable tablet Chew 1 tablet (262 mg total) by mouth as needed for indigestion or  diarrhea or loose stools. 03/23/20   Elson Areas, PA-C  Caraway Oil-Levomenthol (FDGARD) 25-20.75 MG CAPS Take as directed 06/29/20   Benancio Deeds, MD  omeprazole (PRILOSEC) 40 MG capsule Take 1 capsule (40 mg total) by mouth daily. 06/29/20   Armbruster, Willaim Rayas, MD  ondansetron (ZOFRAN-ODT) 4 MG disintegrating tablet Take 1 tablet (4 mg total) by mouth every 8 (eight) hours as needed for nausea or vomiting. 06/29/20   Armbruster, Willaim Rayas, MD  polyethylene glycol (MIRALAX) 17 g packet Take 17 g by mouth daily. 06/29/20   Armbruster, Willaim Rayas, MD    Family History Family History  Problem Relation Age of Onset   Healthy Mother    Healthy Father    Colon cancer Neg Hx    Pancreatic cancer Neg Hx    Esophageal cancer Neg Hx    Liver cancer Neg Hx    Stomach cancer Neg Hx     Social History Social History   Tobacco Use   Smoking status: Former    Types: Cigarettes   Smokeless tobacco: Current    Types: Snuff   Tobacco comments:    Used to smoke local tobacco in [place], now smokes 1 cigarette/day here.  Substance Use Topics   Alcohol use: Never   Drug  use: Never     Allergies   Patient has no known allergies.   Review of Systems Review of Systems + L abdominal pain, epigastric pain and low back pain. Negative for fever, chills, sweats, paresthesia, weakness of his legs, dysuria, difficulty voiding, or rash.   Physical Exam Triage Vital Signs ED Triage Vitals  Enc Vitals Group     BP 07/25/20 1745 132/83     Pulse Rate 07/25/20 1745 60     Resp 07/25/20 1745 20     Temp 07/25/20 1745 98.6 F (37 C)     Temp Source 07/25/20 1745 Oral     SpO2 07/25/20 1745 100 %     Weight --      Height --      Head Circumference --      Peak Flow --      Pain Score 07/25/20 1751 10     Pain Loc --      Pain Edu? --      Excl. in GC? --    No data found.  Updated Vital Signs BP 132/83   Pulse 60   Temp 98.6 F (37 C) (Oral)   Resp 20   SpO2 100%    Visual Acuity Right Eye Distance:   Left Eye Distance:   Bilateral Distance:    Right Eye Near:   Left Eye Near:    Bilateral Near:     Physical Exam Vitals and nursing note reviewed. Exam conducted with a chaperone present.  Constitutional:      General: He is not in acute distress.    Appearance: He is normal weight. He is not toxic-appearing.  HENT:     Head: Normocephalic.  Eyes:     Extraocular Movements: Extraocular movements intact.  Pulmonary:     Effort: Pulmonary effort is normal.  Abdominal:     General: Bowel sounds are normal.     Palpations: Abdomen is soft. There is no hepatomegaly or splenomegaly.     Tenderness: There is abdominal tenderness in the epigastric area. There is no right CVA tenderness, left CVA tenderness, guarding or rebound.     Hernia: A hernia is present. Hernia is present in the left inguinal area.  Genitourinary:    Penis: Normal.      Testes: Normal.       Comments: The soft mass on his inguinal canal is  soft and moderately tender. Is not red or hot. Has normal skin color.  Musculoskeletal:     Cervical back: Normal.     Thoracic back: Normal.     Lumbar back: Tenderness present. Negative right straight leg raise test and negative left straight leg raise test.       Back:     Comments: L SI  Skin:    General: Skin is warm and dry.     Findings: No rash.  Neurological:     Mental Status: He is alert and oriented to person, place, and time.  Psychiatric:        Mood and Affect: Mood normal.        Behavior: Behavior normal.     UC Treatments / Results  Labs (all labs ordered are listed, but only abnormal results are displayed) Labs Reviewed - No data to display  EKG   Radiology No results found.  Procedures Procedures (including critical care time)  Medications Ordered in UC Medications - No data to display  Initial Impression / Assessment and Plan / UC  Course  I have reviewed the triage vital signs and the  nursing notes. Using a translator I explained to pt he does have an inguinal hernia and needs to see a Careers adviser for this. I gave him information, but also explained to him his worker's comp has to authorize for him to see them. IN the mean time I placed him on Ibuprofen for pain, and gave him work restrictions. See instructions.     Final Clinical Impressions(s) / UC Diagnoses   Final diagnoses:  Left inguinal hernia  Lumbar strain, initial encounter     Discharge Instructions      No lifting more than 10 pounds until you see the surgeobn. You may work only 1/2 of your shift      ED Prescriptions     Medication Sig Dispense Auth. Provider   ibuprofen (ADVIL) 800 MG tablet Take 1 tablet (800 mg total) by mouth 3 (three) times daily. 30 tablet Rodriguez-Southworth, Nettie Elm, PA-C      PDMP not reviewed this encounter.   Garey Ham, Cordelia Poche 07/25/20 1935

## 2020-07-25 NOTE — Discharge Instructions (Addendum)
No lifting more than 10 pounds until you see the surgeobn. You may work only 1/2 of your shift

## 2020-07-25 NOTE — ED Triage Notes (Signed)
Erpreter  called for [employer]

## 2020-07-25 NOTE — ED Triage Notes (Signed)
Pt presents with ongoing ABD pain and bladder pain  from work accident 4 days ago. Pt sent to UC for Ultra scan of bladder. Pt thinks he has a hernia.

## 2020-07-25 NOTE — ED Triage Notes (Signed)
Provider at bedside to asses Pt and explain with interpreter Pashto. Pt does not understand why he can not get an [place] at [facility]. Pt instructed thur interpreter he will need to call his insurance .

## 2020-07-31 ENCOUNTER — Ambulatory Visit (INDEPENDENT_AMBULATORY_CARE_PROVIDER_SITE_OTHER): Payer: BC Managed Care – PPO | Admitting: Internal Medicine

## 2020-07-31 ENCOUNTER — Encounter: Payer: Self-pay | Admitting: Internal Medicine

## 2020-07-31 ENCOUNTER — Other Ambulatory Visit: Payer: Self-pay

## 2020-07-31 ENCOUNTER — Encounter (HOSPITAL_COMMUNITY): Payer: Self-pay

## 2020-07-31 ENCOUNTER — Inpatient Hospital Stay (HOSPITAL_COMMUNITY): Payer: BC Managed Care – PPO

## 2020-07-31 ENCOUNTER — Observation Stay (HOSPITAL_COMMUNITY)
Admission: EM | Admit: 2020-07-31 | Discharge: 2020-08-02 | Disposition: A | Discharge status: Home / Self Care | Payer: BC Managed Care – PPO | Attending: Emergency Medicine | Admitting: Emergency Medicine

## 2020-07-31 DIAGNOSIS — K4030 Unilateral inguinal hernia, with obstruction, without gangrene, not specified as recurrent: Secondary | ICD-10-CM | POA: Diagnosis present

## 2020-07-31 DIAGNOSIS — R310 Gross hematuria: Secondary | ICD-10-CM | POA: Diagnosis not present

## 2020-07-31 DIAGNOSIS — K219 Gastro-esophageal reflux disease without esophagitis: Secondary | ICD-10-CM | POA: Diagnosis present

## 2020-07-31 DIAGNOSIS — K4090 Unilateral inguinal hernia, without obstruction or gangrene, not specified as recurrent: Secondary | ICD-10-CM

## 2020-07-31 DIAGNOSIS — Z87891 Personal history of nicotine dependence: Secondary | ICD-10-CM | POA: Insufficient documentation

## 2020-07-31 DIAGNOSIS — Z79899 Other long term (current) drug therapy: Secondary | ICD-10-CM

## 2020-07-31 DIAGNOSIS — F17290 Nicotine dependence, other tobacco product, uncomplicated: Secondary | ICD-10-CM | POA: Diagnosis present

## 2020-07-31 DIAGNOSIS — Z20822 Contact with and (suspected) exposure to covid-19: Secondary | ICD-10-CM | POA: Diagnosis not present

## 2020-07-31 DIAGNOSIS — Z791 Long term (current) use of non-steroidal anti-inflammatories (NSAID): Secondary | ICD-10-CM

## 2020-07-31 LAB — CBC WITH DIFFERENTIAL/PLATELET
Abs Immature Granulocytes: 0.02 10*3/uL (ref 0.00–0.07)
Basophils Absolute: 0.1 10*3/uL (ref 0.0–0.1)
Basophils Relative: 1 %
Eosinophils Absolute: 0.3 10*3/uL (ref 0.0–0.5)
Eosinophils Relative: 5 %
HCT: 48.2 % (ref 39.0–52.0)
Hemoglobin: 16.4 g/dL (ref 13.0–17.0)
Immature Granulocytes: 0 %
Lymphocytes Relative: 41 %
Lymphs Abs: 2.3 10*3/uL (ref 0.7–4.0)
MCH: 30.2 pg (ref 26.0–34.0)
MCHC: 34 g/dL (ref 30.0–36.0)
MCV: 88.8 fL (ref 80.0–100.0)
Monocytes Absolute: 0.4 10*3/uL (ref 0.1–1.0)
Monocytes Relative: 7 %
Neutro Abs: 2.6 10*3/uL (ref 1.7–7.7)
Neutrophils Relative %: 46 %
Platelets: 223 10*3/uL (ref 150–400)
RBC: 5.43 MIL/uL (ref 4.22–5.81)
RDW: 12.3 % (ref 11.5–15.5)
WBC: 5.6 10*3/uL (ref 4.0–10.5)
nRBC: 0 % (ref 0.0–0.2)

## 2020-07-31 LAB — URINALYSIS, ROUTINE W REFLEX MICROSCOPIC
Bacteria, UA: NONE SEEN
Bilirubin Urine: NEGATIVE
Glucose, UA: NEGATIVE mg/dL
Ketones, ur: NEGATIVE mg/dL
Leukocytes,Ua: NEGATIVE
Nitrite: NEGATIVE
Protein, ur: NEGATIVE mg/dL
Specific Gravity, Urine: 1.008 (ref 1.005–1.030)
pH: 6 (ref 5.0–8.0)

## 2020-07-31 LAB — BASIC METABOLIC PANEL
Anion gap: 8 (ref 5–15)
Anion gap: 10 (ref 5–15)
BUN: 12 mg/dL (ref 6–20)
BUN: 12 mg/dL (ref 6–20)
CO2: 30 mmol/L (ref 22–32)
CO2: 30 mmol/L (ref 22–32)
Calcium: 9.7 mg/dL (ref 8.9–10.3)
Calcium: 9.7 mg/dL (ref 8.9–10.3)
Chloride: 106 mmol/L (ref 98–111)
Chloride: 104 mmol/L (ref 98–111)
Creatinine, Ser: 0.69 mg/dL (ref 0.61–1.24)
Creatinine, Ser: 0.79 mg/dL (ref 0.61–1.24)
GFR, Estimated: >60 mL/min (ref >60)
GFR, Estimated: >60 mL/min (ref >60)
Glucose, Bld: 97 mg/dL (ref 70–99)
Glucose, Bld: 95 mg/dL (ref 70–99)
Potassium: 4.1 mmol/L (ref 3.5–5.1)
Potassium: 4.1 mmol/L (ref 3.5–5.1)
Sodium: 144 mmol/L (ref 135–145)
Sodium: 144 mmol/L (ref 135–145)

## 2020-07-31 LAB — CBC
HCT: 46.4 % (ref 39.0–52.0)
Hemoglobin: 15.8 g/dL (ref 13.0–17.0)
MCH: 30 pg (ref 26.0–34.0)
MCHC: 34.1 g/dL (ref 30.0–36.0)
MCV: 88.2 fL (ref 80.0–100.0)
Platelets: 203 10*3/uL (ref 150–400)
RBC: 5.26 MIL/uL (ref 4.22–5.81)
RDW: 12.3 % (ref 11.5–15.5)
WBC: 5.6 10*3/uL (ref 4.0–10.5)
nRBC: 0 % (ref 0.0–0.2)

## 2020-07-31 LAB — LACTIC ACID, PLASMA
Lactic Acid, Venous: 0.8 mmol/L (ref 0.5–1.9)
Lactic Acid, Venous: 1.3 mmol/L (ref 0.5–1.9)

## 2020-07-31 MED ORDER — OXYCODONE HCL 5 MG PO TABS: 10.0000 mg | ORAL_TABLET | ORAL | Status: DC | PRN

## 2020-07-31 MED ORDER — PROCHLORPERAZINE EDISYLATE 10 MG/2ML IJ SOLN
10.0000 mg | INTRAMUSCULAR | Status: DC | PRN
  Administered 2020-08-01: 10 mg via INTRAVENOUS
  Filled 2020-07-31: qty 2

## 2020-07-31 MED ORDER — KETOROLAC TROMETHAMINE 15 MG/ML IJ SOLN
15.0000 mg | Freq: Three times a day (TID) | INTRAMUSCULAR | Status: DC
  Administered 2020-07-31 – 2020-08-02 (×5): 15 mg via INTRAVENOUS
  Filled 2020-07-31 (×5): qty 1

## 2020-07-31 MED ORDER — ENOXAPARIN SODIUM 40 MG/0.4ML IJ SOSY
40.0000 mg | PREFILLED_SYRINGE | INTRAMUSCULAR | Status: DC
  Filled 2020-07-31: qty 0.4

## 2020-07-31 MED ORDER — IOHEXOL 350 MG/ML SOLN
100.0000 mL | Freq: Once | INTRAVENOUS | Status: AC | PRN
  Administered 2020-07-31: 80 mL via INTRAVENOUS

## 2020-07-31 MED ORDER — HYDROMORPHONE HCL 1 MG/ML IJ SOLN
0.5000 mg | INTRAMUSCULAR | Status: DC | PRN
  Administered 2020-08-01: 0.5 mg via INTRAVENOUS
  Filled 2020-07-31: qty 0.5

## 2020-07-31 MED ORDER — ACETAMINOPHEN 325 MG PO TABS
650.0000 mg | ORAL_TABLET | Freq: Four times a day (QID) | ORAL | Status: DC
  Administered 2020-07-31 – 2020-08-02 (×4): 650 mg via ORAL
  Filled 2020-07-31 (×4): qty 2

## 2020-07-31 MED ORDER — METHOCARBAMOL 1000 MG/10ML IJ SOLN
500.0000 mg | Freq: Four times a day (QID) | INTRAVENOUS | Status: DC | PRN
  Filled 2020-07-31: qty 5

## 2020-07-31 MED ORDER — ONDANSETRON HCL 4 MG/2ML IJ SOLN
4.0000 mg | Freq: Four times a day (QID) | INTRAMUSCULAR | Status: DC | PRN
  Administered 2020-08-01: 4 mg via INTRAVENOUS
  Filled 2020-07-31: qty 2

## 2020-07-31 MED ORDER — GABAPENTIN 300 MG PO CAPS
300.0000 mg | ORAL_CAPSULE | Freq: Three times a day (TID) | ORAL | Status: DC
  Administered 2020-07-31 – 2020-08-02 (×5): 300 mg via ORAL
  Filled 2020-07-31 (×5): qty 1

## 2020-07-31 MED ORDER — OXYCODONE HCL 5 MG PO TABS
5.0000 mg | ORAL_TABLET | ORAL | Status: DC | PRN
  Administered 2020-08-01: 5 mg via ORAL
  Filled 2020-07-31: qty 1

## 2020-07-31 MED ORDER — LACTATED RINGERS IV SOLN: INTRAVENOUS | Status: DC

## 2020-07-31 NOTE — ED Notes (Signed)
Pt transported to CT ?

## 2020-07-31 NOTE — Assessment & Plan Note (Signed)
Patient reports a one-time episode of hematuria on 7/21, that self resolved.  Given that patient has had intermittent hematuria, he will need to further work-up in the future.

## 2020-07-31 NOTE — Progress Notes (Signed)
   CC: Hernia follow up  HPI:  [name] is a 35 y.o. with a PMHx as listed below who presents to the clinic for Hernia follow up.   Please see the Encounters tab for problem-based Assessment & Plan regarding status of patient's acute and chronic conditions.  Past Medical History:  Diagnosis Date   Anxiety    Depression    GERD (gastroesophageal reflux disease)    Helicobacter pylori gastritis    Review of Systems: Review of Systems  Constitutional:  Positive for weight loss. Negative for chills, fever and malaise/fatigue.  Respiratory:  Negative for shortness of breath.   Cardiovascular:  Negative for chest pain.  Gastrointestinal:  Positive for abdominal pain and constipation. Negative for blood in stool, diarrhea, melena, nausea and vomiting.  Genitourinary:  Positive for hematuria (once, resolved). Negative for dysuria, frequency and urgency.  Musculoskeletal:  Positive for back pain.  Neurological:  Negative for dizziness, focal weakness, weakness and headaches.   Physical Exam:  Vitals:   07/31/20 0918 07/31/20 0923  BP:  112/66  Pulse:  (!) 55  Temp:  98.2 F (36.8 C)  TempSrc:  Oral  SpO2:  100%  Weight: 162 lb 3.2 oz (73.6 kg)    Physical Exam Vitals reviewed. Exam conducted with a chaperone present.  Constitutional:      General: He is not in acute distress.    Appearance: Normal appearance.  HENT:     Head: Normocephalic and atraumatic.     Mouth/Throat:     Mouth: Mucous membranes are moist.     Pharynx: Oropharynx is clear.  Eyes:     Extraocular Movements: Extraocular movements intact.     Pupils: Pupils are equal, round, and reactive to light.  Abdominal:     Hernia: A hernia is present. Hernia is present in the left inguinal area.  Genitourinary:    Comments: Examination done with patient in supine position due to pain level. At the medial aspect of the inguinal canal, superior to the inguinal line, there is a 2 cm x 2 cm area that is  exquisitely tender to palpation. There is a subtle elevation compared to the right side consistent with a direct inguinal hernia. Due to tenderness, unable to assess reducibility. No erythema.  Lymphadenopathy:     Lower Body: No left inguinal adenopathy.  Skin:    General: Skin is warm and dry.  Neurological:     General: No focal deficit present.     Mental Status: He is oriented to person, place, and time.  Psychiatric:        Mood and Affect: Mood normal.        Behavior: Behavior normal. Behavior is cooperative.        Thought Content: Thought content normal.        Judgment: Judgment normal.    Assessment & Plan:   See Encounters Tab for problem based charting.  Patient discussed with Dr. Mayford Knife

## 2020-07-31 NOTE — Assessment & Plan Note (Signed)
Oscar Hill presents today for follow-up regarding a left inguinal hernia.  He has been seen both in the ER and urgent care for this over the past 2 weeks.  Mr. Oscar Hill states that symptoms first onset on 7/10 when he was stepping down with his right leg at work and had sudden onset sharp pain in his abdomen, particularly in the LLQ.  The pain was so severe, he fainted and hit his back.  He was seen in the ER at this time; a CT abdomen/pelvis was obtained that was negative.  He was discharged with instructions to take senna for mild constipation.  Patient states that between 07/15/20 and 07/25/20, he noticed a bulge in the left groin that would get larger and smaller but remained significantly tender to touch.  Due to this, he went to an urgent care on 07/25/20.  At the time he had an hernia examination that was remarkable for a left inguinal hernia.  The provider recommended he follow-up outpatient with general surgery and discharge patient with instructions to avoid lifting heavy objects.  Today, Oscar Hill states that he continues to have very sharp, 10 out of 10 pain in this left inguinal region.  The hernia continues to get larger and smaller but never subsides all the way.  On 7/21, he had 1 episode of hematuria that self resolved.  He denies any diarrhea, melena, hematochezia, nausea, vomiting.  He has been able to eat and have bowel movements as usual (although usual for him is constipated.)  Assessment/plan: On examination, there is a region located on the medial aspect of the left inguinal canal consistent with a inguinal hernia.  It is remarkably tender on palpation and due to this, I am unable to test how reducible the area is.  I am concerned that the hernia is incarcerated but not yet strangulated. At this time, Oscar Hill has spent 2 weeks in significant amounts of pain without a concrete plan.  Due to these concerns, we have contacted [facility].  After speaking with the triage RN, she  transferred our call to Dr. Dossie Der.  On discussion with Dr. Dossie Der, he recommends that patient go to the [facility], ED with instructions to have the ED provider consult general surgery.  Someone from his team will be able to come evaluate the patient and to come up with a plan from there.  - Transportation arranged for patient to be taken to [facility], ED.  We will reach out to the [facility] provider.

## 2020-07-31 NOTE — Progress Notes (Signed)
Have attempted to do nursing admission history. Pt was in lobby and I waited for him to come back to room. I called [employer] twice and was put on hold each time up to 10 minutes per time. Unable to complete nursing admission history at this time. Pt has bed upstairs and ER staff need to move him out of ER. Briscoe Burns BSN, RN-BC Admissions RN 07/31/2020 7:09 PM

## 2020-07-31 NOTE — Patient Instructions (Addendum)
????????: ??? ? ???? (????? ????????) ??? ???? ??? ?? ?? ????? ???? ?????? ?? ?????? ???. ??? ???????? ??? ?? ???? ????? ???? ?? ??? ??. ??? ?? ????? ?? ????? ?????? ???? ?? ? ???? ??? ????! ?? ?? ?? ????? ???? ?? ??? ??? ?? ??? ???? ???? ??? ?? ??? ????? ?? ???? ????.  ? ????? ???? ????? ???? ???:   [address] ?????[address]? [place] [id number]   ?? ?? ? ???? ???? ?? ?????? ?? ??? ???? ?? ???? ?? ???? ??? ? ??? ?????? ?? ???? ?????? ?? ????? ????.  You are being sent to the Emergency Room in [facility]. We have spoken with the General Surgeon, Dr. Dossie Der, who is on call. A provider from their team will be able to come evaluate you in the ER to come up with a plan for treatment.

## 2020-07-31 NOTE — ED Triage Notes (Signed)
Patient reports that he was sent to his PCP to be evaluated for possible surgery. Patient states a hernia to the left side x 10-12 days.

## 2020-07-31 NOTE — H&P (Signed)
Admitting Physician: Hyman Hopes Murry Khiev  Service: General surgery  CC: Left lower quadrant bulge and pain  Subjective   HPI: Oscar Hill is an 35 y.o. male who is here for left inguinal bulge and pain.  He has been dealing with a bulge in his left groin its been painful for a number of weeks now.  He is seek care in multiple places.  Finally his primary care physician called me prior to admission requesting I examined him and consider fixing a left inguinal hernia.    Past Medical History:  Diagnosis Date   Anxiety    Depression    GERD (gastroesophageal reflux disease)    Helicobacter pylori gastritis     Past Surgical History:  Procedure Laterality Date   UPPER GI ENDOSCOPY  04/11/2020    Family History  Problem Relation Age of Onset   Healthy Mother    Colon cancer Neg Hx    Pancreatic cancer Neg Hx    Esophageal cancer Neg Hx    Liver cancer Neg Hx    Stomach cancer Neg Hx     Social:  reports that he has quit smoking. His smoking use included cigarettes. He has quit using smokeless tobacco.  His smokeless tobacco use included snuff. He reports that he does not drink alcohol and does not use drugs.  Allergies: No Known Allergies  Medications: Current Outpatient Medications  Medication Instructions   bismuth subsalicylate (PEPTO BISMOL) 262 mg, Oral, As needed   [place]way Oil-Levomenthol (FDGARD) 25-20.75 MG CAPS Take as directed   ibuprofen (ADVIL) 800 mg, Oral, 3 times daily   omeprazole (PRILOSEC) 40 mg, Oral, Daily   ondansetron (ZOFRAN-ODT) 4 mg, Oral, Every 8 hours PRN   polyethylene glycol (MIRALAX) 17 g, Oral, Daily    ROS - all of the below systems have been reviewed with the patient and positives are indicated with bold text General: chills, fever or night sweats Eyes: blurry vision or double vision ENT: epistaxis or sore throat Allergy/Immunology: itchy/watery eyes or nasal congestion Hematologic/Lymphatic: bleeding problems, blood clots or  swollen lymph nodes Endocrine: temperature intolerance or unexpected weight changes Breast: new or changing breast lumps or nipple discharge Resp: cough, shortness of breath, or wheezing CV: chest pain or dyspnea on exertion GI: as per HPI GU: dysuria, trouble voiding, or hematuria MSK: joint pain or joint stiffness Neuro: TIA or stroke symptoms Derm: pruritus and skin lesion changes Psych: anxiety and depression  Objective   PE Blood pressure 101/74, pulse (!) 57, temperature 98.2 F (36.8 C), temperature source Oral, resp. rate 16, height 5\' 10"  (1.778 m), weight 73.6 kg, SpO2 99 %. Constitutional: NAD; conversant; no deformities Eyes: Moist conjunctiva; no lid lag; anicteric; PERRL Neck: Trachea midline; no thyromegaly Lungs: Normal respiratory effort; no tactile fremitus CV: RRR; no palpable thrills; no pitting edema GI: Abd palpable inguinal hernia in the left groin, bulging with Valsalva; no palpable hepatosplenomegaly MSK: Normal range of motion of extremities; no clubbing/cyanosis Psychiatric: Appropriate affect; alert and oriented x3 Lymphatic: No palpable cervical or axillary lymphadenopathy  No results found for this or any previous visit (from the past 24 hour(s)).   Imaging Orders  CT ABDOMEN PELVIS W CONTRAST    Assessment and Plan   Oscar Hill is an 35 y.o. male with a symptomatic left inguinal hernia.  I recommended laparoscopic left inguinal hernia pair with mesh.  The procedure itself as well as its risk, benefits, and alternatives were discussed with the patient with the assistance  of the pashto interpreter.  After full discussion all questions answered the patient granted consent to proceed.  We will follow-up on the CT scan that was ordered by the ER physician, and then we we will likely proceed to the operating room tomorrow for left inguinal hernia repair.   Quentin Ore, MD  [facility] Use [email or web address] to contact on call  provider

## 2020-07-31 NOTE — ED Provider Notes (Addendum)
Emergency Medicine Provider Triage Evaluation Note  Oscar Hill , a 35 y.o. male  was evaluated in triage.  Pt complains of HPI was collected while using a interpreter, he is here today because he is having worsening pain in his left lower groin, states it has been going on for last couple weeks.  He was seen at his primary care office today they were concerned for complications from an inguinal hernia, after reviewing patient's charts was seen here on the 10th as well as the 20th for similar complaint, imaging was performed no acute abnormalities present, but physical exam  noticed a 2 x 2 mass was noted in the left groin suspicious for an inguinal hernia.  Patient admits to nausea without vomiting, states he has some constipation but states he still passing gas.  Addendum-spoke with the PCP who sent him here today Dr. Floydene Flock who contacted [facility] general surgery and spoke with Dr Langley Adie,  they would like to be consulted on this patient.  Review of Systems  Positive: Left groin pain, nausea Negative: Testicular pain vomiting  Physical Exam  BP 115/88 (BP Location: Right Arm)   Pulse (!) 58   Temp 98.2 F (36.8 C) (Oral)   Resp 16   Ht 5\' 10"  (1.778 m)   Wt 73.6 kg   SpO2 99%   BMI 23.27 kg/m  Gen:   Awake, no distress   Resp:  Normal effort  MSK:   Moves extremities without difficulty  Other:  With a chaperone present genital exam was performed patient has a 2 x 2 centimeter mass within the left groin region, it is tender to palpation, no fluctuance or induration present, a mass is felt within the left inguinal canal with cough.  Medical Decision Making  Medically screening exam initiated at 2:31 PM.  Appropriate orders placed.  Oscar Hill was informed that the remainder of the evaluation will be completed by another provider, this initial triage assessment does not replace that evaluation, and the importance of remaining in the ED until their evaluation is  complete.  Patient presents with left groin pain, patient will need further work-up in the emergency department.   Oscar Friedlander, PA-C 07/31/20 1435    08/02/20, PA-C 07/31/20 1451    08/02/20, MD 07/31/20 1515

## 2020-08-01 ENCOUNTER — Inpatient Hospital Stay (HOSPITAL_COMMUNITY): Payer: BC Managed Care – PPO | Admitting: Anesthesiology

## 2020-08-01 ENCOUNTER — Inpatient Hospital Stay (HOSPITAL_COMMUNITY): Admission: RE | Admit: 2020-08-01 | Payer: BC Managed Care – PPO | Source: Home / Self Care | Admitting: Surgery

## 2020-08-01 ENCOUNTER — Encounter (HOSPITAL_COMMUNITY): Payer: Self-pay

## 2020-08-01 ENCOUNTER — Encounter (HOSPITAL_COMMUNITY): Admission: EM | Disposition: A | Discharge status: Home / Self Care | Payer: Self-pay | Source: Home / Self Care

## 2020-08-01 HISTORY — PX: INGUINAL HERNIA REPAIR: SHX194

## 2020-08-01 LAB — CBC
HCT: 46.3 % (ref 39.0–52.0)
Hemoglobin: 15.8 g/dL (ref 13.0–17.0)
MCH: 30.4 pg (ref 26.0–34.0)
MCHC: 34.1 g/dL (ref 30.0–36.0)
MCV: 89.2 fL (ref 80.0–100.0)
Platelets: 210 10*3/uL (ref 150–400)
RBC: 5.19 MIL/uL (ref 4.22–5.81)
RDW: 12.2 % (ref 11.5–15.5)
WBC: 6.4 10*3/uL (ref 4.0–10.5)
nRBC: 0 % (ref 0.0–0.2)

## 2020-08-01 LAB — BASIC METABOLIC PANEL
Anion gap: 9 (ref 5–15)
BUN: 14 mg/dL (ref 6–20)
CO2: 27 mmol/L (ref 22–32)
Calcium: 9.7 mg/dL (ref 8.9–10.3)
Chloride: 106 mmol/L (ref 98–111)
Creatinine, Ser: 0.7 mg/dL (ref 0.61–1.24)
GFR, Estimated: >60 mL/min (ref >60)
Glucose, Bld: 112 mg/dL — ABNORMAL HIGH (ref 70–99)
Potassium: 3.6 mmol/L (ref 3.5–5.1)
Sodium: 142 mmol/L (ref 135–145)

## 2020-08-01 LAB — SURGICAL PCR SCREEN
MRSA, PCR: POSITIVE — AB
Staphylococcus aureus: POSITIVE — AB

## 2020-08-01 LAB — SARS CORONAVIRUS 2 BY RT PCR (HOSPITAL ORDER, PERFORMED IN ~~LOC~~ HOSPITAL LAB): SARS Coronavirus 2: NEGATIVE

## 2020-08-01 SURGERY — REPAIR, HERNIA, INGUINAL, LAPAROSCOPIC
Anesthesia: General | Site: Abdomen | Laterality: Left

## 2020-08-01 MED ORDER — ONDANSETRON HCL 4 MG/2ML IJ SOLN: 4.0000 mg | Freq: Once | INTRAMUSCULAR | Status: DC | PRN

## 2020-08-01 MED ORDER — DEXAMETHASONE SODIUM PHOSPHATE 10 MG/ML IJ SOLN
INTRAMUSCULAR | Status: AC
  Filled 2020-08-01: qty 1

## 2020-08-01 MED ORDER — FENTANYL CITRATE (PF) 100 MCG/2ML IJ SOLN
INTRAMUSCULAR | Status: AC
  Filled 2020-08-01: qty 2

## 2020-08-01 MED ORDER — OXYCODONE-ACETAMINOPHEN 5-325 MG PO TABS: 1.0000 | ORAL_TABLET | ORAL | 0 refills | Status: DC | PRN

## 2020-08-01 MED ORDER — HYDROMORPHONE HCL 1 MG/ML IJ SOLN
0.2500 mg | INTRAMUSCULAR | Status: DC | PRN
  Administered 2020-08-01: 0.5 mg via INTRAVENOUS

## 2020-08-01 MED ORDER — HYDROMORPHONE HCL 1 MG/ML IJ SOLN
INTRAMUSCULAR | Status: AC
  Administered 2020-08-01: 0.5 mg via INTRAVENOUS
  Filled 2020-08-01: qty 1

## 2020-08-01 MED ORDER — ONDANSETRON HCL 4 MG/2ML IJ SOLN
INTRAMUSCULAR | Status: AC
  Filled 2020-08-01: qty 2

## 2020-08-01 MED ORDER — FENTANYL CITRATE (PF) 100 MCG/2ML IJ SOLN
INTRAMUSCULAR | Status: DC | PRN
  Administered 2020-08-01: 100 ug via INTRAVENOUS
  Administered 2020-08-01 (×2): 50 ug via INTRAVENOUS

## 2020-08-01 MED ORDER — OXYCODONE HCL 5 MG/5ML PO SOLN: 5.0000 mg | Freq: Once | ORAL | Status: DC | PRN

## 2020-08-01 MED ORDER — OXYCODONE HCL 5 MG PO TABS: 5.0000 mg | ORAL_TABLET | ORAL | Status: DC | PRN

## 2020-08-01 MED ORDER — BUPIVACAINE LIPOSOME 1.3 % IJ SUSP
20.0000 mL | Freq: Once | INTRAMUSCULAR | Status: AC
  Administered 2020-08-01: 20 mL
  Filled 2020-08-01: qty 20

## 2020-08-01 MED ORDER — ONDANSETRON HCL 4 MG/2ML IJ SOLN
INTRAMUSCULAR | Status: DC | PRN
  Administered 2020-08-01: 4 mg via INTRAVENOUS

## 2020-08-01 MED ORDER — ORAL CARE MOUTH RINSE: 15.0000 mL | Freq: Once | OROMUCOSAL | Status: AC

## 2020-08-01 MED ORDER — OXYCODONE HCL 5 MG PO TABS: 5.0000 mg | ORAL_TABLET | Freq: Once | ORAL | Status: DC | PRN

## 2020-08-01 MED ORDER — BUPIVACAINE-EPINEPHRINE (PF) 0.25% -1:200000 IJ SOLN
INTRAMUSCULAR | Status: DC | PRN
  Administered 2020-08-01: 30 mL

## 2020-08-01 MED ORDER — ROCURONIUM BROMIDE 10 MG/ML (PF) SYRINGE
PREFILLED_SYRINGE | INTRAVENOUS | Status: AC
  Filled 2020-08-01: qty 10

## 2020-08-01 MED ORDER — MUPIROCIN 2 % EX OINT
1.0000 "application " | TOPICAL_OINTMENT | Freq: Two times a day (BID) | CUTANEOUS | Status: DC
  Administered 2020-08-01 – 2020-08-02 (×2): 1 via NASAL
  Filled 2020-08-01: qty 22

## 2020-08-01 MED ORDER — LIDOCAINE 2% (20 MG/ML) 5 ML SYRINGE
INTRAMUSCULAR | Status: AC
  Filled 2020-08-01: qty 5

## 2020-08-01 MED ORDER — MIDAZOLAM HCL 2 MG/2ML IJ SOLN
INTRAMUSCULAR | Status: DC | PRN
  Administered 2020-08-01: 2 mg via INTRAVENOUS

## 2020-08-01 MED ORDER — CHLORHEXIDINE GLUCONATE CLOTH 2 % EX PADS
6.0000 | MEDICATED_PAD | Freq: Every day | CUTANEOUS | Status: DC
  Administered 2020-08-01 – 2020-08-02 (×2): 6 via TOPICAL

## 2020-08-01 MED ORDER — PROPOFOL 10 MG/ML IV BOLUS
INTRAVENOUS | Status: DC | PRN
  Administered 2020-08-01: 180 mg via INTRAVENOUS

## 2020-08-01 MED ORDER — LACTATED RINGERS IV SOLN: INTRAVENOUS | Status: DC

## 2020-08-01 MED ORDER — LIDOCAINE 2% (20 MG/ML) 5 ML SYRINGE
INTRAMUSCULAR | Status: DC | PRN
  Administered 2020-08-01: 80 mg via INTRAVENOUS

## 2020-08-01 MED ORDER — SODIUM CHLORIDE 0.9 % IV SOLN
INTRAVENOUS | Status: AC
  Filled 2020-08-01: qty 2

## 2020-08-01 MED ORDER — SODIUM CHLORIDE 0.9 % IV SOLN
2.0000 g | Freq: Once | INTRAVENOUS | Status: AC
  Administered 2020-08-01: 2 g via INTRAVENOUS

## 2020-08-01 MED ORDER — DEXAMETHASONE SODIUM PHOSPHATE 10 MG/ML IJ SOLN
INTRAMUSCULAR | Status: DC | PRN
  Administered 2020-08-01: 10 mg via INTRAVENOUS

## 2020-08-01 MED ORDER — MIDAZOLAM HCL 2 MG/2ML IJ SOLN
INTRAMUSCULAR | Status: AC
  Filled 2020-08-01: qty 2

## 2020-08-01 MED ORDER — PANTOPRAZOLE SODIUM 40 MG PO TBEC
40.0000 mg | DELAYED_RELEASE_TABLET | Freq: Every day | ORAL | Status: DC
  Administered 2020-08-01 – 2020-08-02 (×2): 40 mg via ORAL
  Filled 2020-08-01 (×2): qty 1

## 2020-08-01 MED ORDER — CHLORHEXIDINE GLUCONATE 0.12 % MT SOLN
15.0000 mL | Freq: Once | OROMUCOSAL | Status: AC
  Administered 2020-08-01: 15 mL via OROMUCOSAL

## 2020-08-01 MED ORDER — SUGAMMADEX SODIUM 200 MG/2ML IV SOLN
INTRAVENOUS | Status: DC | PRN
  Administered 2020-08-01: 160 mg via INTRAVENOUS

## 2020-08-01 MED ORDER — BUPIVACAINE-EPINEPHRINE (PF) 0.25% -1:200000 IJ SOLN
INTRAMUSCULAR | Status: AC
  Filled 2020-08-01: qty 30

## 2020-08-01 MED ORDER — PROPOFOL 10 MG/ML IV BOLUS
INTRAVENOUS | Status: AC
  Filled 2020-08-01: qty 20

## 2020-08-01 MED ORDER — 0.9 % SODIUM CHLORIDE (POUR BTL) OPTIME
TOPICAL | Status: DC | PRN
  Administered 2020-08-01: 1000 mL

## 2020-08-01 MED ORDER — ROCURONIUM BROMIDE 10 MG/ML (PF) SYRINGE
PREFILLED_SYRINGE | INTRAVENOUS | Status: DC | PRN
  Administered 2020-08-01: 50 mg via INTRAVENOUS
  Administered 2020-08-01: 10 mg via INTRAVENOUS

## 2020-08-01 SURGICAL SUPPLY — 34 items
BAG COUNTER SPONGE SURGICOUNT (BAG) ×2 IMPLANT
BLADE CLIPPER SENSICLIP SURGIC (BLADE) ×2 IMPLANT
CABLE HIGH FREQUENCY MONO STRZ (ELECTRODE) ×2 IMPLANT
CHLORAPREP W/TINT 26 (MISCELLANEOUS) ×2 IMPLANT
DECANTER SPIKE VIAL GLASS SM (MISCELLANEOUS) IMPLANT
DERMABOND ADVANCED (GAUZE/BANDAGES/DRESSINGS) ×1
DERMABOND ADVANCED .7 DNX12 (GAUZE/BANDAGES/DRESSINGS) ×1 IMPLANT
ELECT REM PT RETURN 15FT ADLT (MISCELLANEOUS) ×2 IMPLANT
GLOVE SURG ENC TEXT LTX SZ7.5 (GLOVE) ×2 IMPLANT
GLOVE SURG UNDER LTX SZ8 (GLOVE) ×2 IMPLANT
GOWN STRL REUS W/ TWL XL LVL3 (GOWN DISPOSABLE) ×1 IMPLANT
GOWN STRL REUS W/TWL LRG LVL3 (GOWN DISPOSABLE) ×6 IMPLANT
GOWN STRL REUS W/TWL XL LVL3 (GOWN DISPOSABLE) ×1
GRASPER SUT TROCAR 14GX15 (MISCELLANEOUS) ×2 IMPLANT
IRRIG SUCT STRYKERFLOW 2 WTIP (MISCELLANEOUS)
IRRIGATION SUCT STRKRFLW 2 WTP (MISCELLANEOUS) IMPLANT
KIT BASIN OR (CUSTOM PROCEDURE TRAY) ×2 IMPLANT
KIT TURNOVER KIT A (KITS) ×2 IMPLANT
MESH 3DMAX LIGHT 4.8X6.7 LT XL (Mesh General) ×2 IMPLANT
NEEDLE INSUFFLATION 120MM (ENDOMECHANICALS) ×2 IMPLANT
NEEDLE INSUFFLATION 14GA 120MM (NEEDLE) ×2 IMPLANT
RELOAD STAPLE HERNIA 4.0 BLUE (INSTRUMENTS) ×2 IMPLANT
RELOAD STAPLE HERNIA 4.8 BLK (STAPLE) IMPLANT
SCISSORS LAP 5X35 DISP (ENDOMECHANICALS) ×2 IMPLANT
SET TUBE SMOKE EVAC HIGH FLOW (TUBING) ×2 IMPLANT
STAPLER HERNIA 12 8.5 360D (INSTRUMENTS) IMPLANT
SUT MNCRL AB 4-0 PS2 18 (SUTURE) ×2 IMPLANT
SUT VICRYL 0 UR6 27IN ABS (SUTURE) IMPLANT
TOWEL OR 17X26 10 PK STRL BLUE (TOWEL DISPOSABLE) ×2 IMPLANT
TRAY FOL W/BAG SLVR 16FR STRL (SET/KITS/TRAYS/PACK) ×1 IMPLANT
TRAY FOLEY W/BAG SLVR 16FR LF (SET/KITS/TRAYS/PACK) ×1
TRAY LAPAROSCOPIC (CUSTOM PROCEDURE TRAY) ×2 IMPLANT
TROCAR BLADELESS OPT 12M 100M (ENDOMECHANICALS) ×2 IMPLANT
TROCAR BLADELESS OPT 5 100 (ENDOMECHANICALS) ×4 IMPLANT

## 2020-08-01 NOTE — Anesthesia Postprocedure Evaluation (Signed)
Anesthesia Post Note  Patient: Oscar Hill  Procedure(s) Performed: LAPAROSCOPIC INGUINAL HERNIA WITH MESH (Left: Abdomen)     Patient location during evaluation: PACU Anesthesia Type: General Level of consciousness: awake and alert and oriented Pain management: pain level controlled Vital Signs Assessment: post-procedure vital signs reviewed and stable Respiratory status: spontaneous breathing, nonlabored ventilation and respiratory function stable Cardiovascular status: blood pressure returned to baseline and stable Postop Assessment: no apparent nausea or vomiting Anesthetic complications: no   No notable events documented.  Last Vitals:  Vitals:   08/01/20 1030 08/01/20 1045  BP: 117/68 113/68  Pulse: 65 (!) 57  Resp: 13 (!) 8  Temp:    SpO2: 97% 97%    Last Pain:  Vitals:   08/01/20 1015  TempSrc:   PainSc: 0-No pain                 Jaire Pinkham A.

## 2020-08-01 NOTE — Progress Notes (Signed)
Progress Note: General Surgery Service   Chief Complaint/Subjective: Doing well. Anticipating surgery  Objective: Vital signs in last 24 hours: Temp:  [97.4 F (36.3 C)-98.2 F (36.8 C)] 97.4 F (36.3 C) (07/27 0743) Pulse Rate:  [55-65] 55 (07/27 0743) Resp:  [16-20] 18 (07/27 0743) BP: (101-118)/(66-88) 118/80 (07/27 0743) SpO2:  [99 %-100 %] 99 % (07/27 0743) Weight:  [73.4 kg-73.6 kg] 73.4 kg (07/26 1955) Last BM Date: 07/31/20  Intake/Output from previous day: No intake/output data recorded. Intake/Output this shift: No intake/output data recorded.  Constitutional: NAD; conversant; no deformities Eyes: Moist conjunctiva; no lid lag; anicteric; PERRL Neck: Trachea midline; no thyromegaly Lungs: Normal respiratory effort; no tactile fremitus CV: RRR; no palpable thrills; no pitting edema GI: Abd palpable left inguinal hernia; no palpable hepatosplenomegaly MSK: Normal range of motion of extremities; no clubbing/cyanosis Psychiatric: Appropriate affect; alert and oriented x3 Lymphatic: No palpable cervical or axillary lymphadenopathy  Lab Results: CBC  Recent Labs    07/31/20 2024 08/01/20 0512  WBC 5.6 6.4  HGB 15.8 15.8  HCT 46.4 46.3  PLT 203 210   BMET Recent Labs    07/31/20 2024 08/01/20 0512  NA 144 142  K 4.1 3.6  CL 104 106  CO2 30 27  GLUCOSE 95 112*  BUN 12 14  CREATININE 0.79 0.70  CALCIUM 9.7 9.7   PT/INR No results for input(s): LABPROT, INR in the last 72 hours. ABG No results for input(s): PHART, HCO3 in the last 72 hours.  Invalid input(s): PCO2, PO2  Anti-infectives: Anti-infectives (From admission, onward)    None       Medications: Scheduled Meds:  [MAR Hold] acetaminophen  650 mg Oral Q6H   [MAR Hold] Chlorhexidine Gluconate Cloth  6 each Topical Q0600   [MAR Hold] enoxaparin (LOVENOX) injection  40 mg Subcutaneous Q24H   [MAR Hold] gabapentin  300 mg Oral TID   [MAR Hold] ketorolac  15 mg Intravenous Q8H   [MAR  Hold] mupirocin ointment  1 application Nasal BID   Continuous Infusions:  lactated ringers 75 mL/hr at 07/31/20 1904   lactated ringers     [MAR Hold] methocarbamol (ROBAXIN) IV     PRN Meds:.[MAR Hold]  HYDROmorphone (DILAUDID) injection, [MAR Hold] methocarbamol (ROBAXIN) IV, [MAR Hold] ondansetron (ZOFRAN) IV, [MAR Hold] oxyCODONE, [MAR Hold] oxyCODONE, [MAR Hold] prochlorperazine  Radiology read did not describe any inguinal hernia, however the incarcerated fat is clearly visible in the left inguinal canal on imaging, please see representative image attached.    Assessment/Plan:  Mr. Weekes has a left inguinal hernia.  I recommended laparoscopic left inguinal hernia repair with mesh.  The procedure itself as well as its risk, benefits, and alternatives were discussed with the patient with the #interpreter.  After full discussion all questions answered patient granted consent to proceed.  We will proceed to the operating room as scheduled.   Quentin Ore, MD  [facility] Use [email or web address] to contact on call provider

## 2020-08-01 NOTE — Anesthesia Procedure Notes (Signed)
Procedure Name: Intubation Date/Time: 08/01/2020 8:49 AM Performed by: Florene Route, CRNA Pre-anesthesia Checklist: Patient identified, Emergency Drugs available, Suction available and Patient being monitored Patient Re-evaluated:Patient Re-evaluated prior to induction Oxygen Delivery Method: Circle system utilized Preoxygenation: Pre-oxygenation with 100% oxygen Induction Type: IV induction Ventilation: Mask ventilation without difficulty and Oral airway inserted - appropriate to patient size Laryngoscope Size: Hyacinth Meeker and 3 Grade View: Grade I Tube type: Oral Tube size: 8.0 mm Number of attempts: 1 Airway Equipment and Method: Stylet and Oral airway Placement Confirmation: ETT inserted through vocal cords under direct vision, positive ETCO2 and breath sounds checked- equal and bilateral Secured at: 22 cm Tube secured with: Tape Dental Injury: Teeth and Oropharynx as per pre-operative assessment

## 2020-08-01 NOTE — Progress Notes (Signed)
Complaining of ongoing pain abdomen, neck and back.  Nausea with food, not relieved by Zofran, Dilaudid 3 hrs ago.  Incision and site looks fine.  He just got toradol, will let him have some compazine, and Dilaudid.  He hasn't taken the PO's because of nausea.  He may need to stay tonight, live with his brother and some roommates.

## 2020-08-01 NOTE — Anesthesia Preprocedure Evaluation (Addendum)
Anesthesia Evaluation  Patient identified by MRN, date of birth, ID band Patient awake    Reviewed: Allergy & Precautions, NPO status , Patient's Chart, lab work & pertinent test results  Airway Mallampati: II  TM Distance: >3 FB Neck ROM: Full    Dental  (+) Teeth Intact, Dental Advisory Given   Pulmonary former smoker,    Pulmonary exam normal breath sounds clear to auscultation       Cardiovascular negative cardio ROS Normal cardiovascular exam Rhythm:Regular Rate:Normal     Neuro/Psych PSYCHIATRIC DISORDERS Anxiety Depression negative neurological ROS     GI/Hepatic Neg liver ROS, GERD  Medicated,  Endo/Other  negative endocrine ROS  Renal/GU negative Renal ROS  negative genitourinary   Musculoskeletal Incarcerated left inguinal hernia Nasal swab + for MRSA   Abdominal (+) - obese,  Abdomen: tender.    Peds  Hematology negative hematology ROS (+)   Anesthesia Other Findings   Reproductive/Obstetrics                             Anesthesia Physical Anesthesia Plan  ASA: 2  Anesthesia Plan: General   Post-op Pain Management:    Induction: Intravenous and Cricoid pressure planned  PONV Risk Score and Plan: Treatment may vary due to age or medical condition, Midazolam, Ondansetron and Dexamethasone  Airway Management Planned: Oral ETT  Additional Equipment:   Intra-op Plan:   Post-operative Plan: Extubation in OR  Informed Consent: I have reviewed the patients History and Physical, chart, labs and discussed the procedure including the risks, benefits and alternatives for the proposed anesthesia with the patient or authorized representative who has indicated his/her understanding and acceptance.     Dental advisory given  Plan Discussed with: CRNA and Anesthesiologist  Anesthesia Plan Comments: (Pashto interpreter used for Preop.)       Anesthesia Quick  Evaluation

## 2020-08-01 NOTE — Op Note (Signed)
Patient: Oscar Hill (Mar 16, 1985, [id number])  Date of Surgery: 07/31/2020 - 08/01/2020   Preoperative Diagnosis: Left inguinal hernia with incarcerated fat  Postoperative Diagnosis: Left inguinal hernia with incarcerated fat  Surgical Procedure: LAPAROSCOPIC INGUINAL HERNIA WITH MESH: [id number]   Operative Team Members:  Surgeon(s) and Role:    * Jeris Easterly, Hyman Hopes, MD - Primary    Hedda Slade, [place] - Assisting   Anesthesiologist: Mal Amabile, MD CRNA: Florene Route, CRNA   Anesthesia: General   Fluids:  Total I/O In: -  Out: 25 [Blood:25]  Complications: None  Drains:  None  Specimen: None  Disposition:  PACU - hemodynamically stable.  Plan of Care: Discharge to home after PACU  Indications for Procedure: Oscar Hill is a 35 y.o. male who presented with a left inguinal hernia with incarcerated fat.  I made the recommendation to proceed with laparoscopic left inguinal hernia repair.  The procedure itself as well as its risk, benefits, and alternatives were discussed with the patient with the assistance of the Pashto interpreter.  The risk discussed included were not limited to the risk of infection, bleeding, damage nearby structures, recurrent hernia, mesh complications.  After full discussion all questions answered the patient granted consent to proceed  Findings:  Technique: Transabdominal preperitoneal (TAPP) Hernia Location: Left indirect inguinal hernia Mesh Size &Type:  Bard 3D Max Left Extra-Large Mesh Fixation: Endo-Universal hernia stapler  Infection status: Patient: Oscar Hill Emergency General Surgery Service Patient Case: Urgent Infection Present At Time Of Surgery (PATOS): None   Description of Procedure:  The patient was positioned supine, padded and secured to the bed, with both arms tucked.  The abdomen was widely prepped and draped.  A time out procedure was performed.  A 1 cm infraumbilical incision was made.  The abdomen was  entered without trauma to the underlying viscera.  The abdomen was insufflated to 15 mm of Hg.  A 12 mm trocar was inserted at the periumbilical incision.  Additional 5 mm trocars were placed in the left and right abdomen.  There was no trauma to the underlying viscera.  There was no hernia evident on the right  There was an indirect inguinal hernia on the left with incarcerated fat.  Utilizing a transabdominal pre peritoneal technique (TAPP), a horizontal incision was made in the peritoneum, immediately below the umbilicus.  Dissection was carried out in the pre peritoneal space down to the level of the hernia sac which was reduced into the peritoneal cavity completely.  The cord contents were parietalized and preserved.  A large pre peritoneal dissection was performed to uncover the direct, indirect, femoral and obturator spaces.  Cooper's ligament was uncovered medially and the psoas muscle uncovered laterally.  The mesh, as documented above, was opened and advanced into the pre peritoneal position so that it more than adequately covered the indirect, direct, femoral and obturator spaces.  The mesh laid flat, with no inferior folds and covered the entire myopectineal orifice.  The mesh was fixated with the endo-universal hernia stapler to Cooper's ligament and the posterior aspect of the rectus muscle.  The peritoneal flap was closed with the same device.  There were no peritoneal defects or exposed mesh at the conclusion.  The umbilical trocar was removed and the fascial defect was closed with a 0 Vicryl suture.  The peritoneal cavity was completely desufflated, the trocars removed and the skin closed with 4-0 Monocryl subcuticular suture and skin glue.  All sponge and needle counts were correct  at the end of the case.  Ivar Drape, MD General, Bariatric, & Minimally Invasive Surgery [facility], [place]

## 2020-08-01 NOTE — Transfer of Care (Signed)
Immediate Anesthesia Transfer of Care Note  Patient: Oscar Hill  Procedure(s) Performed: LAPAROSCOPIC INGUINAL HERNIA WITH MESH (Left: Abdomen)  Patient Location: PACU  Anesthesia Type:General  Level of Consciousness: drowsy  Airway & Oxygen Therapy: Patient Spontanous Breathing and Patient connected to face mask oxygen  Post-op Assessment: Report given to RN and Post -op Vital signs reviewed and stable  Post vital signs: Reviewed and stable  Last Vitals:  Vitals Value Taken Time  BP 132/84 08/01/20 1011  Temp    Pulse 81 08/01/20 1012  Resp 13 08/01/20 1012  SpO2 100 % 08/01/20 1012  Vitals shown include unvalidated device data.  Last Pain:  Vitals:   08/01/20 0755  TempSrc: Oral  PainSc:          Complications: No notable events documented.

## 2020-08-02 ENCOUNTER — Encounter (HOSPITAL_COMMUNITY): Payer: Self-pay | Admitting: Surgery

## 2020-08-02 LAB — BASIC METABOLIC PANEL
Anion gap: 5 (ref 5–15)
BUN: 15 mg/dL (ref 6–20)
CO2: 26 mmol/L (ref 22–32)
Calcium: 8.8 mg/dL — ABNORMAL LOW (ref 8.9–10.3)
Chloride: 109 mmol/L (ref 98–111)
Creatinine, Ser: 0.88 mg/dL (ref 0.61–1.24)
GFR, Estimated: >60 mL/min (ref >60)
Glucose, Bld: 107 mg/dL — ABNORMAL HIGH (ref 70–99)
Potassium: 3.9 mmol/L (ref 3.5–5.1)
Sodium: 140 mmol/L (ref 135–145)

## 2020-08-02 LAB — CBC
HCT: 40.3 % (ref 39.0–52.0)
Hemoglobin: 13.6 g/dL (ref 13.0–17.0)
MCH: 30.2 pg (ref 26.0–34.0)
MCHC: 33.7 g/dL (ref 30.0–36.0)
MCV: 89.4 fL (ref 80.0–100.0)
Platelets: 181 10*3/uL (ref 150–400)
RBC: 4.51 MIL/uL (ref 4.22–5.81)
RDW: 12.3 % (ref 11.5–15.5)
WBC: 14.2 10*3/uL — ABNORMAL HIGH (ref 4.0–10.5)
nRBC: 0 % (ref 0.0–0.2)

## 2020-08-02 MED ORDER — ACETAMINOPHEN 325 MG PO TABS: ORAL_TABLET | ORAL | Status: DC

## 2020-08-02 MED ORDER — NAPROXEN SODIUM 220 MG PO TABS: ORAL_TABLET | ORAL | Status: DC

## 2020-08-02 MED ORDER — SENNOSIDES-DOCUSATE SODIUM 8.6-50 MG PO TABS: ORAL_TABLET | ORAL | 11 refills | Status: DC

## 2020-08-02 NOTE — Discharge Summary (Signed)
Physician Discharge Summary  Patient ID: Oscar Hill MRN: [id number] DOB/AGE: 03-23-1985 35 y.o.  Admit date: 07/31/2020 Discharge date: 08/02/2020  Admission Diagnoses:   Abdominal pain Left inguinal hernia with incarcerated fat GERD  Discharge Diagnoses:  Same  Active Problems:   Incarcerated inguinal hernia   PROCEDURES: LAPAROSCOPIC INGUINAL HERNIA WITH MESH: [id number], 08/01/20, Dr. Ivar Drape, MD  Hospital Course:  Oscar Hill is an 35 y.o. male who is here for left inguinal bulge and pain.  He has been dealing with a bulge in his left groin its been painful for a number of weeks now.  He is seek care in multiple places.  Finally his primary care physician called me prior to admission requesting I examined him and consider fixing a left inguinal hernia.    He was seen and admitted by Dr. Dossie Der.  He underwent surgery as noted above.  He had some pain and nausea issues post op and we kept him overnight.  By the AM this morning he was doing well.  Thru the translator we gave him discharge instructions and I believe he understands them.    CBC Latest Ref Rng & Units 08/02/2020 08/01/2020 07/31/2020  WBC 4.0 - 10.5 K/uL 14.2(H) 6.4 5.6  Hemoglobin 13.0 - 17.0 g/dL 91.4 78.2 95.6  Hematocrit 39.0 - 52.0 % 40.3 46.3 46.4  Platelets 150 - 400 K/uL 181 210 203    CMP Latest Ref Rng & Units 08/02/2020 08/01/2020 07/31/2020  Glucose 70 - 99 mg/dL 213(Y) 865(H) 95  BUN 6 - 20 mg/dL 15 14 12   Creatinine 0.61 - 1.24 mg/dL 8.46 9.62  Sodium 135 - 145 mmol/L 140 142 144  Potassium 3.5 - 5.1 mmol/L 3.9 3.6 4.1  Chloride 98 - 111 mmol/L 109 106 104  CO2 22 - 32 mmol/L 26 27 30   Calcium 8.9 - 10.3 mg/dL 9.52) 9.7 9.7  Total Protein 6.5 - 8.1 g/dL - - -  Total Bilirubin 0.3 - 1.2 mg/dL - - -  Alkaline Phos 38 - 126 U/L - - -  AST 15 - 41 U/L - - -  ALT 0 - 44 U/L - - -     Condition on DC:  improved    Disposition: Discharge disposition: 01-Home or Self  Care       Discharge Instructions     Diet - low sodium heart healthy   Complete by: As directed    Discharge instructions   Complete by: As directed    No heavy lifting or strenuous activity for 4 weeks. No driving while taking pain medication. Do not submerge incisions underwater - it is okay to get incisions wet and soapy in the shower.   Increase activity slowly   Complete by: As directed       Allergies as of 08/02/2020   No Known Allergies      Medication List     TAKE these medications    acetaminophen 325 MG tablet Commonly known as: TYLENOL You can take Tylenol in addition to the ibuprofen for pain relief.  Follow the package directions.  Do not take more than 4000 mg of Tylenol/acetaminophen per day it can harm your liver.   bismuth subsalicylate 262 MG chewable tablet Commonly known as: PEPTO BISMOL Chew 1 tablet (262 mg total) by mouth as needed for indigestion or diarrhea or loose stools.   FDgard 25-20.75 MG Caps Generic drug: [place] Oil-Levomenthol Take as directed   ibuprofen 800 MG tablet Commonly known as: ADVIL  Take 1 tablet (800 mg total) by mouth 3 (three) times daily.   naproxen sodium 220 MG tablet Commonly known as: ALEVE You can use this after you complete the Ibuprofen. Do not take both Ibuprofen and Aleve at the same time.  It can cause an ulcer. What changed:  how much to take how to take this when to take this reasons to take this additional instructions   omeprazole 40 MG capsule Commonly known as: PRILOSEC Take 1 capsule (40 mg total) by mouth daily.   ondansetron 4 MG disintegrating tablet Commonly known as: ZOFRAN-ODT Take 1 tablet (4 mg total) by mouth every 8 (eight) hours as needed for nausea or vomiting.   oxyCODONE-acetaminophen 5-325 MG tablet Commonly known as: Percocet Take 1 tablet by mouth every 4 (four) hours as needed for severe pain.   polyethylene glycol 17 g packet Commonly known as: MiraLax Take 17  g by mouth daily.   senna-docusate 8.6-50 MG tablet Commonly known as: Senokot-S This pill is a laxative and stool softner. What changed:  how much to take how to take this when to take this additional instructions        Follow-up Information     Stechschulte, Hyman Hopes, MD Follow up on 08/22/2020.   Specialty: Surgery Why: Your appointment is at 2:20 PM.  Be at the office 45 minutes early for check in.  Bring an interpreter with you if possible.  Bring a Building services engineer ID and insurance information. Contact information: [address] [place] [address] [phone]                 Signed: Sherrie George 08/02/2020, 4:58 PM

## 2020-08-02 NOTE — Progress Notes (Signed)
1 Day Post-Op    CC: Abdominal pain  Subjective: I spent 45 minutes with the patient, 2 interpreters, a friend and a local gentleman who is helping with his transition to the [place].  It seems that someone told him he had prostate cancer, and I eventually explained this was not true and tried to explain an inguinal hernia.  We also spent a good deal of time going over his medicines he has Prilosec prescribed for his GERD symptoms.  He has Aleve, ibuprofen and some Senokot also.  I spent a good deal of time trying to explain what the medicines were for and how to use them. From a surgical standpoint he looks great.  He is tolerating food and his port sites look good.  There is no significant swelling or erythema postprocedure.  Objective: Vital signs in last 24 hours: Temp:  [97.7 F (36.5 C)-98 F (36.7 C)] 97.9 F (36.6 C) (07/28 0513) Pulse Rate:  [55-75] 66 (07/28 0513) Resp:  [12-20] 20 (07/28 0513) BP: (99-118)/(54-76) 101/63 (07/28 0513) SpO2:  [95 %-99 %] 97 % (07/28 0513) Last BM Date: 07/31/20 Urine not recorded but he did eat some lunch/supper 3000 IV Voided x3 Afebrile, vital signs are stable WBC up slightly 14.2 otherwise labs are normal.  Intake/Output from previous day: 07/27 0701 - 07/28 0700 In: 3000 [I.V.:2900; IV Piggyback:100] Out: 25 [Blood:25] Intake/Output this shift: Total I/O In: 120 [P.O.:120] Out: -   General appearance: alert, cooperative, and no distress Resp: clear to auscultation bilaterally GI: Soft, pretty sore on the left lower abdomen and inguinal canal.  Tolerating diet.  No erythema.  Lab Results:  Recent Labs    08/01/20 0512 08/02/20 0538  WBC 6.4 14.2*  HGB 15.8 13.6  HCT 46.3 40.3  PLT 210 181    BMET Recent Labs    08/01/20 0512 08/02/20 0538  NA 142 140  K 3.6 3.9  CL 106 109  CO2 27 26  GLUCOSE 112* 107*  BUN 14 15  CREATININE 0.70 0.88  CALCIUM 9.7 8.8*   PT/INR No results for input(s): LABPROT, INR in  the last 72 hours.  No results for input(s): AST, ALT, ALKPHOS, BILITOT, PROT, ALBUMIN in the last 168 hours.   Lipase     Component Value Date/Time   LIPASE 53 (H) 03/22/2020 2035     Medications:  acetaminophen  650 mg Oral Q6H   Chlorhexidine Gluconate Cloth  6 each Topical Q0600   enoxaparin (LOVENOX) injection  40 mg Subcutaneous Q24H   gabapentin  300 mg Oral TID   ketorolac  15 mg Intravenous Q8H   mupirocin ointment  1 application Nasal BID   pantoprazole  40 mg Oral Daily    Assessment/Plan  Left inguinal hernia with incarcerated fat Left inguinal hernia repair with mesh 08/01/2020 Dr. Renae Fickle Stechschulte POD #1  FEN: Regular diet/IV fluid ID: Preop Ancef DVT: Lovenox Follow-up:  From [place] Speaks only [place] Hx anxiety depression Hx GERD Hx H pylori gastritis   Plan: He will go home today.  Hopefully he understands all the instructions and will be able to follow-up in our office.   LOS: 2 days    Baird Polinski 08/02/2020 Please see Amion

## 2020-08-02 NOTE — Progress Notes (Signed)
MD order to discharge.   @ 1235 Discharge instructions reviewed with pt via translator [phone] and also reviewed with family friend at bedside.   Patient, pt's brother, and family friend all verbalize understanding.   Placed in [facility] and wheeled to front of hospital where his friend was there to pick him up. D/C home.

## 2020-08-02 NOTE — Discharge Instructions (Signed)
CCS _______[facility]  UMBILICAL OR INGUINAL HERNIA REPAIR: POST OP INSTRUCTIONS  Always review your discharge instruction sheet given to you by the facility where your surgery was performed. IF YOU HAVE DISABILITY OR FAMILY LEAVE FORMS, YOU MUST BRING THEM TO THE OFFICE FOR PROCESSING.   DO NOT GIVE THEM TO YOUR DOCTOR.  1. A  prescription for pain medication may be given to you upon discharge.  Take your pain medication as prescribed, if needed.  If narcotic pain medicine is not needed, then you may take acetaminophen (Tylenol) or ibuprofen (Advil) as needed. 2. Take your usually prescribed medications unless otherwise directed. If you need a refill on your pain medication, please contact your pharmacy.  They will contact our office to request authorization. Prescriptions will not be filled after 5 pm or on week-ends. 3. You should follow a light diet the first 24 hours after arrival home, such as soup and crackers, etc.  Be sure to include lots of fluids daily.  Resume your normal diet the day after surgery. 4.Most patients will experience some swelling and bruising around the umbilicus or in the groin and scrotum.  Ice packs and reclining will help.  Swelling and bruising can take several days to resolve.  6. It is common to experience some constipation if taking pain medication after surgery.  Increasing fluid intake and taking a stool softener (such as Colace) will usually help or prevent this problem from occurring.  A mild laxative (Milk of Magnesia or Miralax) should be taken according to package directions if there are no bowel movements after 48 hours. 7. Unless discharge instructions indicate otherwise, you may remove your bandages 24-48 hours after surgery, and you may shower at that time.  You may have steri-strips (small skin tapes) in place directly over the incision.  These strips should be left on the skin for 7-10 days.  If your surgeon used skin glue on the  incision, you may shower in 24 hours.  The glue will flake off over the next 2-3 weeks.  Any sutures or staples will be removed at the office during your follow-up visit. 8. ACTIVITIES:  You may resume regular (light) daily activities beginning the next day--such as daily self-care, walking, climbing stairs--gradually increasing activities as tolerated.  You may have sexual intercourse when it is comfortable.  Refrain from any heavy lifting or straining until approved by your doctor.  a.You may drive when you are no longer taking prescription pain medication, you can comfortably wear a seatbelt, and you can safely maneuver your car and apply brakes. b.RETURN TO WORK:   _____________________________________________  9.You should see your doctor in the office for a follow-up appointment approximately 2-3 weeks after your surgery.  Make sure that you call for this appointment within a day or two after you arrive home to insure a convenient appointment time. 10.OTHER INSTRUCTIONS: _________________________    _____________________________________  WHEN TO CALL YOUR DOCTOR: Fever over 101.0 Inability to urinate Nausea and/or vomiting Extreme swelling or bruising Continued bleeding from incision. Increased pain, redness, or drainage from the incision  The clinic staff is available to answer your questions during regular business hours.  Please don't hesitate to call and ask to speak to one of the nurses for clinical concerns.  If you have a medical emergency, go to the nearest emergency room or call 911.  A surgeon from [facility] is always on call at the hospital   [address], [address],   [place], [place]  [address] ?  [address] [address], [place], [place]   [address] [phone] ? [phone] ? FAX [phone] Web site: [email or web address]  

## 2020-08-05 NOTE — Progress Notes (Signed)
Internal Medicine Clinic Attending  Case discussed with Dr. Huel Cote  At the time of the visit.  We reviewed the resident's history and exam and pertinent patient test results.  I agree with the assessment, diagnosis, and plan of care documented in the resident's note.  Thanks to Dr. Renaee Munda diligence and care coordination, Oscar Hill underwent successful hernia repair within 24 hrs of this [facility] visit.

## 2020-08-29 ENCOUNTER — Encounter: Payer: Self-pay | Admitting: Gastroenterology

## 2020-08-29 ENCOUNTER — Ambulatory Visit (INDEPENDENT_AMBULATORY_CARE_PROVIDER_SITE_OTHER): Payer: BC Managed Care – PPO | Admitting: Gastroenterology

## 2020-08-29 VITALS — BP 116/70 | HR 67 | Ht 69.0 in | Wt 158.0 lb

## 2020-08-29 DIAGNOSIS — K5900 Constipation, unspecified: Secondary | ICD-10-CM

## 2020-08-29 DIAGNOSIS — R1013 Epigastric pain: Secondary | ICD-10-CM | POA: Diagnosis not present

## 2020-08-29 DIAGNOSIS — F411 Generalized anxiety disorder: Secondary | ICD-10-CM

## 2020-08-29 MED ORDER — OMEPRAZOLE 40 MG PO CPDR: 40.0000 mg | DELAYED_RELEASE_CAPSULE | Freq: Every day | ORAL | 1 refills | Status: DC

## 2020-08-29 MED ORDER — POLYETHYLENE GLYCOL 3350 17 G PO PACK: 17.0000 g | PACK | Freq: Every day | ORAL | 0 refills | Status: DC

## 2020-08-29 MED ORDER — BUSPIRONE HCL 15 MG PO TABS: 15.0000 mg | ORAL_TABLET | Freq: Two times a day (BID) | ORAL | 2 refills | Status: DC

## 2020-08-29 NOTE — Patient Instructions (Addendum)
If you are age 37 or older, your body mass index should be between 23-30. Your Body mass index is 23.33 kg/m. If this is out of the aforementioned range listed, please consider follow up with your Primary Care Provider.  If you are age 91 or younger, your body mass index should be between 19-25. Your Body mass index is 23.33 kg/m. If this is out of the aformentioned range listed, please consider follow up with your Primary Care Provider.   __________________________________________________________  We have sent the following medications to your pharmacy for you to pick up at your convenience: Buspirone 15 mg: Take twice a day Omeprazole 40 mg: Take once daily  Please purchase the following medications over the counter and take as directed: Miralax: Take as directed once a day  We have scheduled you for a follow up visit on Wednesday, 11-16 at 9:00 am.   Thank you for entrusting me with your care and for choosing [facility], Dr. Ileene Patrick

## 2020-08-29 NOTE — Progress Notes (Signed)
HPI :  35 year old male here for a follow-up visit for dyspepsia, upper abdominal pain, constipation.    Recall he is from [place] and does not speak any [place].  He is accompanied by an interpreter today.  He was initially seen by Hyacinth Meeker in March 2022.  At that time he endorsed longstanding history of abdominal complaints, had a history of ulcer and H. pylori when in [place].  Had been on PPI and Pepcid with some improvement of his symptoms.  Back in February he endorsed some melena.  He had been treated for H. pylori in [place] prior to coming to the [place].   He underwent an upper endoscopy with me on April 6.  Remarkable findings were 1 cm hiatal hernia.  His stomach was normal, biopsies for H. pylori eradication showed no evidence of persistent H. pylori.  Small bowel is normal, biopsies were normal as well. 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  We had discussed that it is controversial whether or not this is pathogenic but in light of his symptoms we treated him with a course of Flagyl.  He had a follow-up stool study on May 20 that was negative for any persistent Blastocystis.  He did not think that the antibiotics helped the symptoms.  Recall that he had a CT scan in March of this year that was normal.   At her last visit he had endorsed some NSAID use.  I had asked him to stop using NSAIDs and to continue the omeprazole.  I also added some Zofran and FD guard.  I also gave him some samples of MiraLAX and asked him to purchase that over-the-counter for constipation.  He had a right upper quadrant ultrasound but otherwise ruled out gallstones.  Translator present again today for history.  Patient stable.  He was admitted to the hospital for an incarcerated left inguinal hernia since have last seen him and he had that repaired.  He states he has some postoperative tenderness there but has mostly recovered.   Otherwise states his stomach is about the same.  He ran out of omeprazole, he does feel some benefit on it but symptoms have certainly persisted.  He continues to have some epigastric discomfort and gas after he eats.  FD guard he states he had "side effects" and did not like taking it so stopped.  He did not find much benefit from the Zofran.  He is no longer using NSAIDs routinely.  He states the MiraLAX helped to treat his constipation however it appears he only took the samples and never purchased it, asking about refills for that.  He is not having any blood in his stool.  Has been [place] about a year at this point.  He has had a major dietary change since coming here.  He does endorse anxiety at baseline with his symptoms.    EGD 04/11/20 -  - A 1 cm hiatal hernia was present. 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 The exam of the esophagus was otherwise normal. - The entire examined stomach was normal. Biopsies were taken with a cold forceps for Helicobacter pylori testing. - The duodenal bulb and second portion of the duodenum were normal. Biopsies for histology were taken with a cold forceps for evaluation of celiac disease.     1. Surgical [P], duodenal - BENIGN DUODENAL MUCOSA - NO ACUTE  INFLAMMATION, VILLOUS BLUNTING OR INCREASED INTRAEPITHELIAL LYMPHOCYTES IDENTIFIED 2. Surgical [P], gastric antrum and gastric body - BENIGN GASTRIC MUCOSA WITH FEATURES CONSISTENT WITH PROTON PUMP INHIBITOR EFFECT - NO H. PYLORI, INTESTINAL METAPLASIA OR MALIGNANCY IDENTIFIED   Stool ova / parasite 05/25/20 - negative   CT scan abdomen pelvis with contrast 03/23/20 - negative    RUQ [place] 07/11/20 -  IMPRESSION: Focal fatty infiltration along the falciform ligament. Otherwise unremarkable right upper quadrant ultrasound. No gallstones   CT scan abdomen / pelvis with contrast 07/31/20 -  IMPRESSION: 1. No acute intra-abdominal or pelvic pathology.  No bowel obstruction. Normal appendix. 2. There is a 4 mm nodule in the right lower lobe. No follow-up needed if patient is low-risk. Non-contrast chest CT can be considered in 12 months if patient is high-risk. This recommendation follows the consensus statement: Guidelines for Management of Incidental Pulmonary Nodules Detected on CT Images: From the Fleischner Society 2017; Radiology 2017; [phone]   Admitted for incarcerated left inguinal hernia which occurred on 08/01/20.   Past Medical History:  Diagnosis Date   Anxiety    Depression    GERD (gastroesophageal reflux disease)    Helicobacter pylori gastritis      Past Surgical History:  Procedure Laterality Date   INGUINAL HERNIA REPAIR Left 08/01/2020   Procedure: LAPAROSCOPIC INGUINAL HERNIA WITH MESH;  Surgeon: Stechschulte, Hyman Hopes, MD;  Location: [facility];  Service: General;  Laterality: Left;   UPPER GI ENDOSCOPY  04/11/2020   Family History  Problem Relation Age of Onset   Healthy Mother    Colon cancer Neg Hx    Pancreatic cancer Neg Hx    Esophageal cancer Neg Hx    Liver cancer Neg Hx    Stomach cancer Neg Hx    Social History   Tobacco Use   Smoking status: Former    Types: Cigarettes   Smokeless tobacco: Former    Types: Snuff  Vaping Use   Vaping Use: Never used  Substance Use Topics   Alcohol use: Never   Drug use: Never   Current Outpatient Medications  Medication Sig Dispense Refill   ibuprofen (ADVIL) 800 MG tablet Take 1 tablet (800 mg total) by mouth 3 (three) times daily. 30 tablet 0   naproxen sodium (ALEVE) 220 MG tablet You can use this after you complete the Ibuprofen. Do not take both Ibuprofen and Aleve at the same time.  It can cause an ulcer.     omeprazole (PRILOSEC) 40 MG capsule Take 1 capsule (40 mg total) by mouth daily. 90 capsule 1   oxyCODONE-acetaminophen (PERCOCET) 5-325 MG tablet Take 1 tablet by mouth every 4 (four) hours as needed for severe pain. 15 tablet 0    senna-docusate (SENOKOT-S) 8.6-50 MG tablet This pill is a laxative and stool softner. 60 tablet 11   No current facility-administered medications for this visit.   No Known Allergies   Review of Systems: All systems reviewed and negative except where noted in HPI.   Lab Results  Component Value Date   WBC 14.2 (H) 08/02/2020   HGB 13.6 08/02/2020   HCT 40.3 08/02/2020   MCV 89.4 08/02/2020   PLT 181 08/02/2020    Lab Results  Component Value Date   CREATININE 0.88 08/02/2020   BUN 15 08/02/2020   NA 140 08/02/2020   K 3.9 08/02/2020   CL 109 08/02/2020   CO2 26 08/02/2020    Lab Results  Component Value Date   ALT 33  05/10/2020   AST 27 05/10/2020   ALKPHOS 76 05/10/2020   BILITOT 0.8 05/10/2020      CT ABDOMEN PELVIS W CONTRAST  Result Date: 07/31/2020 CLINICAL DATA:  35 year old male with abdominal pain. Concern for hernia. EXAM: CT ABDOMEN AND PELVIS WITH CONTRAST TECHNIQUE: Multidetector CT imaging of the abdomen and pelvis was performed using the standard protocol following bolus administration of intravenous contrast. CONTRAST:  80mL OMNIPAQUE IOHEXOL 350 MG/ML SOLN COMPARISON:  CT abdomen pelvis dated 03/23/2020. ultrasound dated 07/11/2020. FINDINGS: Evaluation of this exam is limited due to respiratory motion artifact. Lower chest: There is a 4 mm nodule in the right lower lobe (3/57). The visualized lung bases are otherwise clear. No intra-abdominal free air or free fluid. Hepatobiliary: No focal liver abnormality is seen. No gallstones, gallbladder wall thickening, or biliary dilatation. Pancreas: Unremarkable. No pancreatic ductal dilatation or surrounding inflammatory changes. Spleen: Normal in size without focal abnormality. Adrenals/Urinary Tract: The adrenal glands unremarkable. The kidneys, visualized ureters, and urinary bladder appear unremarkable. Stomach/Bowel: There is no bowel obstruction or active inflammation. The appendix is normal.  Vascular/Lymphatic: The abdominal aorta and IVC unremarkable. No portal venous gas. There is no adenopathy. Reproductive: The prostate and seminal vesicles are grossly unremarkable. No pelvic mass. Other: None Musculoskeletal: No acute or significant osseous findings. IMPRESSION: 1. No acute intra-abdominal or pelvic pathology. No bowel obstruction. Normal appendix. 2. There is a 4 mm nodule in the right lower lobe. No follow-up needed if patient is low-risk. Non-contrast chest CT can be considered in 12 months if patient is high-risk. This recommendation follows the consensus statement: Guidelines for Management of Incidental Pulmonary Nodules Detected on CT Images: From the Fleischner Society 2017; Radiology 2017; 284:228-243. Electronically Signed   By: Elgie CollardArash  Radparvar M.D.   On: 07/31/2020 20:28    Physical Exam: BP 116/70   Pulse 67   Ht 5\' 9"  (1.753 m)   Wt 158 lb (71.7 kg)   BMI 23.33 kg/m  Constitutional: Pleasant,well-developed, male in no acute distress. Neurological: Alert and oriented to person place and time. Psychiatric: Normal mood and affect. Behavior is normal.   ASSESSMENT AND PLAN: 35 year old male here for reassessment of the following:  Dyspepsia Constipation Anxiety  Patient has undergone a significant amount of testing for dyspepsia.  H. pylori has been eradicated without any change in his symptoms.  Blastocystis was eradicated without any change in symptoms.  He has had a few CT scans now showing no other concerning pathology.  Right upper quadrant ultrasound without gallstones.  Some benefit with PPI but not resolution.  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  He is quite anxious about this.  I reassured him in regards to his work-up to date.  I discussed his anxiety we discussed medications to treat his  dyspepsia.  I think buspirone is a good option and I discussed this with him, he is interested in pursuing this.  We will start buspirone 15 mg twice daily and see how he does over the next several weeks.  He should contact me if he does not tolerate it or he does not think it is helping.  I will refill his omeprazole, he would prefer to take this rather than being off of it.  His constipation is not well controlled since he is not taking anything, I counseled him that MiraLAX  is to be purchased over-the-counter and is nonprescription.  I provided him some coupons today as well as some additional free samples.  He will take that every day.  I will see him back in follow-up in 3 months  Plan: - start Buspirone 15mg  BID  - refilll omeprazole 40mg   - Miralax daily - he needs to purchase this OTC, coupons, samples provided - follow up in 3 months  , MD [facility]

## 2020-09-20 ENCOUNTER — Other Ambulatory Visit: Payer: Self-pay | Admitting: Gastroenterology

## 2020-11-21 ENCOUNTER — Encounter: Payer: Self-pay | Admitting: Gastroenterology

## 2020-11-21 ENCOUNTER — Ambulatory Visit (INDEPENDENT_AMBULATORY_CARE_PROVIDER_SITE_OTHER): Payer: BC Managed Care – PPO | Admitting: Gastroenterology

## 2020-11-21 VITALS — BP 108/70 | HR 68 | Ht 68.0 in | Wt 170.2 lb

## 2020-11-21 DIAGNOSIS — K219 Gastro-esophageal reflux disease without esophagitis: Secondary | ICD-10-CM | POA: Diagnosis not present

## 2020-11-21 DIAGNOSIS — R1013 Epigastric pain: Secondary | ICD-10-CM

## 2020-11-21 DIAGNOSIS — K5900 Constipation, unspecified: Secondary | ICD-10-CM | POA: Diagnosis not present

## 2020-11-21 MED ORDER — OMEPRAZOLE 40 MG PO CPDR: 40.0000 mg | DELAYED_RELEASE_CAPSULE | Freq: Two times a day (BID) | ORAL | 1 refills | Status: DC

## 2020-11-21 MED ORDER — BUSPIRONE HCL 15 MG PO TABS: 7.5000 mg | ORAL_TABLET | Freq: Two times a day (BID) | ORAL | 1 refills | Status: DC

## 2020-11-21 NOTE — Patient Instructions (Signed)
If you are age 37 or older, your body mass index should be between 23-30. Your Body mass index is 25.89 kg/m. If this is out of the aforementioned range listed, please consider follow up with your Primary Care Provider.  If you are age 84 or younger, your body mass index should be between 19-25. Your Body mass index is 25.89 kg/m. If this is out of the aformentioned range listed, please consider follow up with your Primary Care Provider.   ________________________________________________________  The [facility] providers would like to encourage you to use [facility] to communicate with providers for non-urgent requests or questions.  Due to long hold times on the telephone, sending your provider a message by [facility] may be a faster and more efficient way to get a response.  Please allow 48 business hours for a response.  Please remember that this is for non-urgent requests.  _______________________________________________________   Oscar Hill can open Buspar and take 1/2  Two times a day.  Take Omeprazole twice a day.  Continue Miralax once a day.  We would like to see you in 6 months, in May 2023.  Thank you for entrusting me with your care and for choosing [facility], Dr. Ileene Patrick

## 2020-11-21 NOTE — Progress Notes (Signed)
HPI :  35 year old male here for a follow-up visit for dyspepsia, upper abdominal pain, constipation.     Recall he is from [place] and does not speak any [place].  He has previously been accompanied by an interpreter however none available today, we used interpreter on video which worked well.  He was initially seen by Ellouise Newer in March 2022.  At that time he endorsed longstanding history of abdominal complaints, had a history of ulcer and H. pylori when in [place].  Had been on PPI and Pepcid with some improvement of his symptoms.  Back in February he endorsed some melena.  He had been treated for H. pylori in [place] prior to coming to the [place]. He underwent an upper endoscopy with me on April 6.  Remarkable findings were 1 cm hiatal hernia.  His stomach was normal, biopsies for H. pylori eradication showed no evidence of persistent H. pylori.  Small bowel is normal, biopsies were normal as well. 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  We had discussed that it is controversial whether or not this is pathogenic but in light of his symptoms we treated him with a course of Flagyl.  He had a follow-up stool study on May 20 that was negative for any persistent Blastocystis.  He did not think that the antibiotics helped the symptoms.  Recall that he had a CT scan in March of this year that was normal. At a prior visit he had endorsed some NSAID use.  I had asked him to stop using NSAIDs and to continue the omeprazole.  I also added some Zofran and FD guard.  I also gave him some samples of MiraLAX and asked him to purchase that over-the-counter for constipation.  He had a right upper quadrant ultrasound but otherwise ruled out gallstones.   During his course in recent months he had an incarcerated left inguinal hernia.  He had some postoperative pain at the site and was given some oxycodone initially and then transition to  gabapentin, he is following with general surgery for that.  At the last visit we discussed that he does have some baseline anxiety. Has been [place] about a year at this point.  He has had a major dietary change since coming here.  I provided reassurance after our last visit and started him on buspirone 15 mg twice daily, continue PPI. He states that buspirone has definitely provided some benefit to his stomach and into his nerves.  He thinks compared to prior, I it has definitely helped his stomach feel better.  He he does state if he takes 15 mg twice daily, it makes him a bit sedated and he feels "funny on it".  If he takes it once daily he can tolerate that much better.  He has been taking it 50 mg once daily for the most part.  He has continued to take omeprazole once daily.  That has controlled his reflux for the most part although he does get breakthrough when he has clear food triggers such as hot or spicy foods.  I reviewed again with him if he was taking any NSAIDs routinely and he has not.  He has been taking MiraLAX for constipation and states it is working well, is happy with the regimen moving his bowels regularly.  He did try taking FD guard and states he did not like the way it made him feel so he  stopped it.  He is taking gabapentin as needed for neuropathic pain.  He inquires about the narcotics that he was given to him by the surgeon, states that made him feel much better and took all of his pains away, asked if he should have more of that.  Otherwise he has concerns about his ability to read his bills and communication from our office.  We spent some time discussing that as well.  Prior work-up: EGD 04/11/20 -  - A 1 cm hiatal hernia was present. 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 The exam of the esophagus was otherwise normal. - The entire examined stomach was normal. Biopsies were taken with a cold forceps for Helicobacter pylori  testing. - The duodenal bulb and second portion of the duodenum were normal. Biopsies for histology were taken with a cold forceps for evaluation of celiac disease.     1. Surgical [P], duodenal - BENIGN DUODENAL MUCOSA - NO ACUTE INFLAMMATION, VILLOUS BLUNTING OR INCREASED INTRAEPITHELIAL LYMPHOCYTES IDENTIFIED 2. Surgical [P], gastric antrum and gastric body - BENIGN GASTRIC MUCOSA WITH FEATURES CONSISTENT WITH PROTON PUMP INHIBITOR EFFECT - NO H. PYLORI, INTESTINAL METAPLASIA OR MALIGNANCY IDENTIFIED   Stool ova / parasite 05/25/20 - negative   CT scan abdomen pelvis with contrast 03/23/20 - negative    RUQ [place] 07/11/20 -  IMPRESSION: Focal fatty infiltration along the falciform ligament. Otherwise unremarkable right upper quadrant ultrasound. No gallstones     CT scan abdomen / pelvis with contrast 07/31/20 -  IMPRESSION: 1. No acute intra-abdominal or pelvic pathology. No bowel obstruction. Normal appendix. 2. There is a 4 mm nodule in the right lower lobe. No follow-up needed if patient is low-risk. Non-contrast chest CT can be considered in 12 months if patient is high-risk. This recommendation follows the consensus statement: Guidelines for Management of Incidental Pulmonary Nodules Detected on CT Images: From the Fleischner Society 2017; Radiology 2017; [phone]     Admitted for incarcerated left inguinal hernia which occurred on 08/01/20.    Past Medical History:  Diagnosis Date   Anxiety    Depression    GERD (gastroesophageal reflux disease)    Helicobacter pylori gastritis      Past Surgical History:  Procedure Laterality Date   INGUINAL HERNIA REPAIR Left 08/01/2020   Procedure: LAPAROSCOPIC INGUINAL HERNIA WITH MESH;  Surgeon: Stechschulte, Nickola Major, MD;  Location: [facility];  Service: General;  Laterality: Left;   UPPER GI ENDOSCOPY  04/11/2020   Family History  Problem Relation Age of Onset   Healthy Mother    Colon cancer Neg Hx    Pancreatic cancer  Neg Hx    Esophageal cancer Neg Hx    Liver cancer Neg Hx    Stomach cancer Neg Hx    Social History   Tobacco Use   Smoking status: Former    Types: Cigarettes   Smokeless tobacco: Former    Types: Snuff  Vaping Use   Vaping Use: Never used  Substance Use Topics   Alcohol use: Never   Drug use: Never   Current Outpatient Medications  Medication Sig Dispense Refill   busPIRone (BUSPAR) 15 MG tablet TAKE 1 TABLET BY MOUTH 2 TIMES DAILY. (Patient taking differently: Take 15 mg by mouth daily.) 180 tablet 1   gabapentin (NEURONTIN) 100 MG capsule Take 100 mg by mouth 3 (three) times daily.     ibuprofen (ADVIL) 800 MG tablet Take 1 tablet (800 mg total)  by mouth 3 (three) times daily. 30 tablet 0   naproxen sodium (ALEVE) 220 MG tablet You can use this after you complete the Ibuprofen. Do not take both Ibuprofen and Aleve at the same time.  It can cause an ulcer.     omeprazole (PRILOSEC) 40 MG capsule Take 1 capsule (40 mg total) by mouth daily. 90 capsule 1   oxyCODONE-acetaminophen (PERCOCET) 5-325 MG tablet Take 1 tablet by mouth every 4 (four) hours as needed for severe pain. 15 tablet 0   polyethylene glycol (MIRALAX) 17 g packet Take 17 g by mouth daily. 14 each 0   No current facility-administered medications for this visit.   No Known Allergies   Review of Systems: All systems reviewed and negative except where noted in HPI.   Lab Results  Component Value Date   WBC 14.2 (H) 08/02/2020   HGB 13.6 08/02/2020   HCT 40.3 08/02/2020   MCV 89.4 08/02/2020   PLT 181 08/02/2020    Lab Results  Component Value Date   CREATININE 0.88 08/02/2020   BUN 15 08/02/2020   NA 140 08/02/2020   K 3.9 08/02/2020   CL 109 08/02/2020   CO2 26 08/02/2020    Lab Results  Component Value Date   ALT 33 05/10/2020   AST 27 05/10/2020   ALKPHOS 76 05/10/2020   BILITOT 0.8 05/10/2020      Physical Exam: BP 108/70 (BP Location: Left Arm, Patient Position: Sitting, Cuff  Size: Normal)   Pulse 68   Ht 5\' 8"  (1.727 m) Comment: height measured without shoes  Wt 170 lb 4 oz (77.2 kg)   BMI 25.89 kg/m  Constitutional: Pleasant,well-developed, male in no acute distress. Neurological: Alert and oriented to person place and time. Psychiatric: Normal mood and affect. Behavior is normal.   ASSESSMENT AND PLAN: 35 year old male here for reassessment of following:  Dyspepsia GERD Constipation  Patient has baseline anxiety, major dietary changes since moving to her country within the past year.  He had an extensive work-up to date as outlined above.  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  He was placed on buspirone and has had definite improvement since the last time of seeing him although does not tolerate the higher dosing as well.  We discussed taking buspirone 7.5 mg twice daily versus 50 mg nightly, which ever his preference.  He is having some slight breakthrough in his reflux at times, he can increase omeprazole to 40 mg twice daily as needed if that will provide additional help.  He is staying off the NSAIDs which is good.  He is using MiraLAX daily which is controlling his constipation and is happy with that.  He is continuing gabapentin as needed for his postoperative pain in his inguinal area.  I counseled him on oxycodone, he should not be using that for long-term, he has run out of it and understands he will not continue that.  We otherwise discussed his difficulty about reading his bills, he should contact his caseworker and discussed that, our front desk was working with him on that, we will also need to contact him for follow-up appointments using a translator or writing a letter.  He arrived at our desk yesterday and had the wrong date for his appointment,  Plan: - continue Buspirone 7.5mg  BID vs 15mg  q HS - increase omeprazole to 40mg  BID - no oxycodone - no Aleve / NSAIDs - continue Miralax daily - follow up 6 months.  Due to  language  barrier several minutes spent discussing these issues with the patient today as well as her staff.  I spent 45 minutes of time, including in depth chart review, communicating results with the patient directly, face-to-face time with the patient, and documentation.  Jolly Mango, MD [facility]

## 2021-04-02 ENCOUNTER — Telehealth: Payer: Self-pay

## 2021-04-02 NOTE — Telephone Encounter (Signed)
-----   Message from Cooper Render, CMA sent at 11/21/2020  9:47 AM EST ----- ?Regarding: pt needs an appt ?Call patient with the language line (speaks Pashto) and schedule him for a follow up appointment in May with Dr. Adela Lank (constipation, abdominal pain, GERD) if he hasn't already called to schedule ? ?

## 2021-04-02 NOTE — Telephone Encounter (Signed)
Called patient using the language line and scheduled him for an appointment in May with Dr. Adela Lank.  Patient confirmed. Office address was verified ?

## 2021-04-05 ENCOUNTER — Other Ambulatory Visit: Payer: Self-pay | Admitting: Gastroenterology

## 2021-05-16 ENCOUNTER — Encounter: Payer: Self-pay | Admitting: Gastroenterology

## 2021-05-16 ENCOUNTER — Ambulatory Visit: Payer: BC Managed Care – PPO | Admitting: Gastroenterology

## 2021-05-16 VITALS — BP 102/60 | HR 57 | Ht 68.5 in | Wt 169.0 lb

## 2021-05-16 DIAGNOSIS — R6881 Early satiety: Secondary | ICD-10-CM

## 2021-05-16 DIAGNOSIS — F411 Generalized anxiety disorder: Secondary | ICD-10-CM

## 2021-05-16 DIAGNOSIS — K5900 Constipation, unspecified: Secondary | ICD-10-CM

## 2021-05-16 DIAGNOSIS — R1013 Epigastric pain: Secondary | ICD-10-CM | POA: Diagnosis not present

## 2021-05-16 MED ORDER — LINACLOTIDE 145 MCG PO CAPS: 145.0000 ug | ORAL_CAPSULE | Freq: Every day | ORAL | 0 refills | Status: DC

## 2021-05-16 MED ORDER — METOCLOPRAMIDE HCL 5 MG PO TABS: 5.0000 mg | ORAL_TABLET | Freq: Three times a day (TID) | ORAL | 0 refills | Status: DC

## 2021-05-16 MED ORDER — DULOXETINE HCL 30 MG PO CPEP: ORAL_CAPSULE | ORAL | 1 refills | Status: DC

## 2021-05-16 NOTE — Progress Notes (Signed)
? ?HPI :  ?36 year old male here for a follow-up visit for dyspepsia, upper abdominal pain, constipation.   ?  ?Recall he is from [place] and does not speak any [place].  Interpreter present today to provide history. He was initially seen by Ellouise Newer in March 2022.  At that time he endorsed longstanding history of abdominal complaints, had a history of ulcer and H. pylori when in [place].  Had been on PPI and Pepcid with some improvement of his symptoms.  Back in February 2022 he endorsed some melena.  He had been treated for H. pylori in [place] prior to coming to the [place]. He underwent an upper endoscopy with me on April 11 2020.  Remarkable findings were 1 cm hiatal hernia.  His stomach was normal, biopsies for H. pylori eradication showed no evidence of persistent H. pylori.  Small bowel was normal, biopsies were normal as well. 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  We had discussed that it is controversial whether or not this is pathogenic but in light of his symptoms we treated him with a course of Flagyl.  He had a follow-up stool study on May 20 that was negative for any persistent Blastocystis.  CT scan in March 2022 was normal. ?At a prior visit he had endorsed some NSAID use.  I had asked him to stop using NSAIDs and to continue the omeprazole.  I also added some Zofran and FD guard.  I also gave him some samples of MiraLAX and asked him to purchase that over-the-counter for constipation.  He had a right upper quadrant ultrasound but otherwise ruled out gallstones. ?  ?During his course in recent months he had an incarcerated left inguinal hernia.  He had some postoperative pain at the site and was given some oxycodone initially and then transition to gabapentin, he is following with general surgery for that. ? ?We have previously discussed his history of anxiety since [place] the and states.  I previously had  placed him on budesonide, and he thought this helped his anxiety and stomach at her last visit. ? ?I last saw him in November.  He states he is doing "okay" since have last seen him but continues to have some abdominal symptoms that continue to bother him.  He still has dyspepsia when he eats.  He states he feels a heaviness in his upper belly and is now having early satiety.  He has tried some FD guard in the past which did not help.  Omeprazole appears to be controlling his reflux.  He is not having any vomiting, has occasional nausea.  He previously thought buspirone had helped him but he does not think it has lately.  He states his anxiety is poorly controlled and it has not been helping that.  He continues to take gabapentin for his groin pain.  He states his symptoms are worse when he eats meat and eggs. ? ?He has had bothersome constipation that continues.  I had previously recommended MiraLAX for him to take every day and titrate up as needed.  He states he does not like the taste of it and feels he regurgitates it and stopped taking it.  He is having to hard stools per week and strains to use the bathroom.  No blood in his stools.  He is currently not taking anything for this. ? ?He otherwise asks about pains in his legs.  He states  he needs to wrap his legs with a sheet at night to keep them still, questions if he has restless leg syndrome.  He has not seen his primary care about this yet. ? ?  ?Prior work-up: ?EGD 04/11/20 -  ?- A 1 cm hiatal hernia was present. 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 The exam of ?the esophagus was otherwise normal. ?- The entire examined stomach was normal. Biopsies were taken with a cold forceps for ?Helicobacter pylori testing. ?- The duodenal bulb and second portion of the duodenum were normal. Biopsies for histology ?were taken with a cold forceps for evaluation of celiac disease. ?  ?  ?1. Surgical [P], duodenal ?- BENIGN  DUODENAL MUCOSA ?- NO ACUTE INFLAMMATION, VILLOUS BLUNTING OR INCREASED INTRAEPITHELIAL LYMPHOCYTES IDENTIFIED ?2. Surgical [P], gastric antrum and gastric body ?- BENIGN GASTRIC MUCOSA WITH FEATURES CONSISTENT WITH PROTON PUMP INHIBITOR EFFECT ?- NO H. PYLORI, INTESTINAL METAPLASIA OR MALIGNANCY IDENTIFIED ?  ?Stool ova / parasite 05/25/20 - negative ?  ?CT scan abdomen pelvis with contrast 03/23/20 - negative ?  ?  ?RUQ [place] 07/11/20 -  ?IMPRESSION: ?Focal fatty infiltration along the falciform ligament. Otherwise ?unremarkable right upper quadrant ultrasound. No gallstones ?  ?  ?CT scan abdomen / pelvis with contrast 07/31/20 -  ?IMPRESSION: ?1. No acute intra-abdominal or pelvic pathology. No bowel ?obstruction. Normal appendix. ?2. There is a 4 mm nodule in the right lower lobe. No follow-up ?needed if patient is low-risk. Non-contrast chest CT can be ?considered in 12 months if patient is high-risk. This recommendation ?follows the consensus statement: Guidelines for Management of ?Incidental Pulmonary Nodules Detected on CT Images: From the ?Fleischner Society 2017; Radiology 2017; [phone] ?  ?  ?Admitted for incarcerated left inguinal hernia which occurred on 08/01/20. ?  ? ?Past Medical History:  ?Diagnosis Date  ? Anxiety   ? Depression   ? GERD (gastroesophageal reflux disease)   ? Helicobacter pylori gastritis   ? ? ? ?Past Surgical History:  ?Procedure Laterality Date  ? INGUINAL HERNIA REPAIR Left 08/01/2020  ? Procedure: LAPAROSCOPIC INGUINAL HERNIA WITH MESH;  Surgeon: Stechschulte, Nickola Major, MD;  Location: [facility];  Service: General;  Laterality: Left;  ? UPPER GI ENDOSCOPY  04/11/2020  ? ?Family History  ?Problem Relation Age of Onset  ? Healthy Mother   ? Colon cancer Neg Hx   ? Pancreatic cancer Neg Hx   ? Esophageal cancer Neg Hx   ? Liver cancer Neg Hx   ? Stomach cancer Neg Hx   ? ?Social History  ? ?Tobacco Use  ? Smoking status: Former  ?  Types: Cigarettes  ? Smokeless tobacco: Former  ?  Types:  Snuff  ?Vaping Use  ? Vaping Use: Never used  ?Substance Use Topics  ? Alcohol use: Never  ? Drug use: Never  ? ?Current Outpatient Medications  ?Medication Sig Dispense Refill  ? busPIRone (BUSPAR) 15 MG tablet Take 0.5 tablets (7.5 mg total) by mouth 2 (two) times daily. 180 tablet 1  ? gabapentin (NEURONTIN) 100 MG capsule Take 100 mg by mouth 3 (three) times daily.    ? omeprazole (PRILOSEC) 40 MG capsule TAKE 1 CAPSULE (40 MG TOTAL) BY MOUTH DAILY. 30 capsule 2  ? ?No current facility-administered medications for this visit.  ? ?No Known Allergies ? ? ?Review of Systems: ?All systems reviewed and negative except where noted in HPI.  ? ? ? ? ?Physical Exam: ?BP 102/60  Pulse (!) 57   Ht 5' 8.5" (1.74 m)   Wt 169 lb (76.7 kg)   BMI 25.32 kg/m?  ?Constitutional: Pleasant,well-developed, male in no acute distress. ?Neurological: Alert and oriented to person place and time. ?Skin: Skin is warm and dry. No rashes noted. ?Psychiatric: Normal mood and affect. Behavior is normal. ? ? ?ASSESSMENT AND PLAN: ?36 year old male here for reassessment of the following: ? ?Dyspepsia ?Early satiety ?Constipation ?Anxiety ? ?Patient with multiple upper and lower tract symptoms in the setting of anxiety in [place]  I reassured him of the work-up he has had with endoscopy, imaging, labs, I do not think were missing anything concerning at this point.  I do think he may largely have functional dyspepsia but with his early satiety, possibly has underlying gastroparesis.  We discussed if he wanted to pursue a gastric emptying study versus empiric trial of Reglan.  I counseled him on the risks of Reglan, I think low-dose 5 mg 3 times daily for a few week trial is reasonable to see if it helps.  If he feels it makes a significant difference we can continue it.  If it does not help too much we will stop it.  Of note, given we are starting Cymbalta as below, asked him to hold on starting Reglan  until he was on Cymbalta for a few weeks and tolerating it well, in case he has any side effects, so we know which medication is causing what. ? ?We discussed his anxiety, I do not think the buspirone is helping h

## 2021-05-16 NOTE — Patient Instructions (Addendum)
If you are age 38 or older, your body mass index should be between 23-30. Your Body mass index is 25.32 kg/m?Marland Kitchen If this is out of the aforementioned range listed, please consider follow up with your Primary Care Provider. ? ?If you are age 22 or younger, your body mass index should be between 19-25. Your Body mass index is 25.32 kg/m?Marland Kitchen If this is out of the aformentioned range listed, please consider follow up with your Primary Care Provider.  ? ?________________________________________________________ ? ?The [facility] providers would like to encourage you to use [facility] to communicate with providers for non-urgent requests or questions.  Due to long hold times on the telephone, sending your provider a message by [facility] may be a faster and more efficient way to get a response.  Please allow 48 business hours for a response.  Please remember that this is for non-urgent requests.  ?_______________________________________________________ ? ?We have scheduled you for a follow up appointment at [facility] with Dr. Clinton Sawyer office on 05-22-21 at 3:15 pm. They are located at [address], ground floor. ?___________________________________________________________ ?Continue Omeprazole. ? ?Stop Miralax and Buspirone (Buspar) ? ?We have given you samples of the following medication to take: ?Linzess 145 mcg: Take once daily in the morning.  IF you find this effective, you can pick up a prescription.  If not, don't pick up the prescription. ? ?Cymbalta 30 mg :Take one tablet once a day for 2 weeks then increase to 2 tablets daily ? ?Reglan 5 mg: Take three times a week before meals.  Wait 2 weeks after starting Cymbalta before you start this medication ? ?Thank you for entrusting me with your care and for choosing [employer], ?Dr. Sheridan Cellar ?  ? ? ?

## 2021-05-17 ENCOUNTER — Other Ambulatory Visit: Payer: Self-pay

## 2021-05-17 MED ORDER — LINACLOTIDE 145 MCG PO CAPS: 145.0000 ug | ORAL_CAPSULE | Freq: Every day | ORAL | 1 refills | Status: DC

## 2021-05-20 ENCOUNTER — Telehealth: Payer: Self-pay

## 2021-05-20 MED ORDER — LINACLOTIDE 145 MCG PO CAPS: 145.0000 ug | ORAL_CAPSULE | Freq: Every day | ORAL | 1 refills | Status: AC

## 2021-05-20 NOTE — Telephone Encounter (Signed)
-----   Message from Roetta Sessions, [place] sent at 05/16/2021  4:34 PM EDT ----- ?Regarding: send script for Linzess 145 mcg ?Send script for Linzess 145 mcg: 30 day with 1 refill ? ?

## 2021-05-21 ENCOUNTER — Encounter: Payer: Self-pay | Admitting: Internal Medicine

## 2021-05-21 ENCOUNTER — Ambulatory Visit: Payer: BC Managed Care – PPO | Admitting: Internal Medicine

## 2021-05-21 DIAGNOSIS — F4321 Adjustment disorder with depressed mood: Secondary | ICD-10-CM | POA: Diagnosis not present

## 2021-05-21 DIAGNOSIS — K5900 Constipation, unspecified: Secondary | ICD-10-CM | POA: Diagnosis not present

## 2021-05-21 DIAGNOSIS — G8929 Other chronic pain: Secondary | ICD-10-CM

## 2021-05-21 DIAGNOSIS — M25562 Pain in left knee: Secondary | ICD-10-CM

## 2021-05-21 DIAGNOSIS — K30 Functional dyspepsia: Secondary | ICD-10-CM | POA: Diagnosis not present

## 2021-05-21 DIAGNOSIS — M25561 Pain in right knee: Secondary | ICD-10-CM

## 2021-05-21 NOTE — Assessment & Plan Note (Signed)
See A&P for functional dyspepsia ?

## 2021-05-21 NOTE — Patient Instructions (Addendum)
?? ????? ???? ???? ??? ???? ?? ??? ?? ?? ????? ????? ?? ?? ??? ????? ??????? ???? ???. ?? ?? ??? ???: ? ?? ???? ??????? / ? ???? ???: ?- ??? ???   Cymbalta 30mg  ??? ??? ?- ?????? ????? ?- ?? ???? ?? Cymbalta ?? ?? ????? ???? ? ????? ????? ?????? ??? ??? ?- ??? ??? ?????????? 40 ??? ????? ?? ???? ????? ? ?? ????? ???: ?- ?? ??? ?? ??? ??? Gabapentin 100mg  ?? ????? ????? ?- Cymbalta ? ????? ??? ??? ?? ????? ???? ?? ?- ???? ???? ?? ??????? ?? ?????? ?? ??? ?? ??? ??? ?? 1000 ??? ????? ???? ?????? ???? ?? ? ???????? ????? ? ???????? ???? ? ????? ??????? ???? ?? ??? ???? ???? ?????? ?????? ????? ???. ?[facility] ?[address] ????? ????? ??????  ?????????? ????? ???????? ????? ????: [phone]  ???: [phone] ? ???? ???? ??????: [email or web address]? ? ???????? ???? ? ????? ????? ????? ?? 6 ?????? ?? ????? ??? ?? ??? ?? ??? ???? ???? ?????? ?????? ????? ???. ? ???????? ???? ??? ?????? ??? ?? ????? ? ???????? ????? ??? 15 ????? ???? ????. ?? ???? ?????? ????? ?? ???? ??? ?? ? ??? ???? ??? ?????? ???. ? ???? ?? ?? ???? ??? ???? ?? ????? ?? ??? ??. ??????? ???? ???? ?????? ?? ?? [phone] ????? ????? ?? ???? ???? ?????? ?? ??????? ???. ? ??? ???? ???? ??? ? ?????? ??? ???? ? ???? ?? 9 ??? ??? ?? 4 ??? ???? ??? ??? ?? ??? ???? ??? ?? 24/7 ???? ???. ?? ? ??????? ?? ???? ??? ??????? ? ?????? ???? ????? ?? ??? ???? ?? ? ????? ????? ? ????????? ?? ?????? ?????? ????. ?? ???? ? ????? ??? ????? ?? ????? ??? ? ??????? ???? ??? ???? ???? ??? ??????? ?? ??? ????? ?? ??? ?? ??? ?? ?????? ??????. ? ????? ?? ??? ?? ????? ????? ?? ????? ?? ??????? ?? ???? ?????. ????? ? ???? ?? ????! ? ????? ???????? MD ?05/21/2021? 4:57 PM ?? IM ?????????? PGY-1 ? ? ? ?Thank you, Mr.Miquel Todisco for allowing 05/23/2021 to provide your care today. Today we discussed: ? ?Acid Reflux/Abdominal Symptoms: ?-Start Cymbalta 30mg  daily ?-Hold Reglan ?-If you feel well on the Cymbalta, start Linzess for  constipation ?-Continue Omeprazole 40mg  daily ? ?Knee Pain: ?-Continue Gabapentin 100mg  three times a day ?-Cymbalta can also help with knee pain ?-Can also take Tylenol, can take up to 1,000mg  three times a day ? ?STOP Buspirone ? ?Please call Dr. Ashok Cordia office to set up a follow up appointment. ?[facility] ?[address] [place],  ?[place]: [phone]  Fax: [phone] ? ?My Chart Access: ?[email or web address] ? ?Please make sure to arrive 15 minutes prior to your next appointment. If you arrive late, you may be asked to reschedule.  ?  ? ?

## 2021-05-21 NOTE — Progress Notes (Signed)
?CC: follow up for epigastric pain ? ?HPI: ? ?[name] is a 36 y.o. male with a past medical history stated below. Patient requires a Armed forces operational officer interpreter, in-person interpreter present for this visit. ? ?Functional dyspepsia ?Patient was referred to gastroenterology for long history of epigastric pain/dyspepsia. Had EGD performed in April 2022 which showed hiatal hernia, negative for H. Pylori, no ulcers or malignancy. 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  CT scan abdomen pelvis in March 2022 was normal.  RUQ ultrasound ruled out gallstones.  On omeprazole which helps with his acid reflux symptoms. Still having significant symptoms (reflux, bloating) with recent worsening after stating new medications prescribed by GI. ? ?Patient recently seen for follow-up 5/11 by Dr. Havery Moros with [facility] for dyspepsia.  GI feels patient may have functional dyspepsia though given symptoms of early satiety differential includes underlying gastroparesis.  For this GI wanted to trial Reglan.  They started patient on Cymbalta 30 mg for functional dyspepsia which may also help with his anxiety, was told to discontinue BuSpar.  He was instructed to start Cymbalta for 2 weeks and then if tolerating, start Reglan low-dose 3 times a day for 1 week.  Unfortunately, patient is unable to speak or read English, and I suspect that he did not understand the instructions to start Cymbalta first.  He started both medications together and had worsening of his GI upset symptoms.  Patient is not sure which medication caused worsening of symptoms.  Patient is also not sure which medication he was told to discontinue.  We also discussed patient's chronic constipation for which she has previously tried MiraLAX though was unable to tolerate this due to worsening reflux symptoms.  Patient was instructed to try  Linzess samples to see if this helps though patient was hesitant to start this after side effects from his other 2 medications. ? ?Today, patient also notes that when he gets his GI symptoms he concurrently has a headache, racing heart rate, joint pain.  On exam, no red hot swollen joints, patient has mild TTP of epigastric area.  Otherwise physical exam unremarkable. ? ?On assessment, patient has history of chronic abdominal symptoms/dyspepsia with likely diagnosis of functional dyspepsia for which he was recently prescribed Cymbalta.  GI's differential also included gastroparesis for which he was also started on Reglan.  Unfortunately patient did not adhere to medication initiation timing instructions likely secondary to language barrier and started both medications at the same time and had side effects with this.  Unclear which medication caused side effects.  Given multiple associated symptoms which patient has only during his episodes of abdominal symptoms, suspect functional dyspepsia is a large component of his symptom burden.  Patient would benefit from Dieterich Cymbalta and holding off on Reglan for now. ?Plan: ?-Restart Cymbalta 30 mg daily, follow-up with GI ?-Hold Reglan ?-Continue Omeprazole ?-Start Linzess for constipation if he is tolerating Cymbalta ?-AVS printed in Pashto (google translate) and proofread by interpreter (no errors in written translation that she noted) ?-Discussed eating high-fiber diet, increasing water intake, and trialing other over-the-counter medications for constipation ?-Follow up in one month ? ?Constipation ?See A&P for functional dyspepsia ? ?Bilateral chronic knee pain ?Patient has one year history of bilateral knee pain. Knee pain occurs under two circumstances: 1. Concurrently with his GI symptoms 2. After a long day of work 12+ hours on his feet. No injury  to the knees. Knee exam unremarkable today. Patient reports improvement in his knee pain when taking gabapentin.  Discussed importance of avoiding aleve, motrin, naproxen etc due to dyspepsia.  ? ?Patient's knee pain which occurs concurrently with his abdominal pain is concerning for chronic pain syndrome vs the likely overuse pain that patient gets form long days at work.  I suspect starting Cymbalta can help both with functional dyspepsia as well as his other chronic pain symptoms that he associates with his GI symptoms.  We will continue gabapentin if this is helping.  Discussed using Tylenol up to 1000 mg 3 times daily instead of NSAIDs. ?Plan: ?-Start Cymbalta 30 mg daily ?-Start Tylenol up to 1000 mg 3 times daily as needed ?-Continue gabapentin 100 mg 3 times daily as needed ?-Avoid NSAIDs ? ?Situational depression ?Patient has history of situational depression as well as symptoms of anxiety. He is not currently on an antidepressant.  Patient has expressed depressed mood since moving to the [place].  He is also been noted by other medical providers to have increased anxiety surrounding his symptoms especially his GI symptoms.   ? ?Patient is instructed to start Cymbalta 30 mg daily for functional dyspepsia associated with other pain symptoms such as knee pain and headache.  I anticipate this will help treat patient's depression and anxiety as well.  Discussed that Cymbalta can take several weeks before medication is fully effective.  Plan to increase dose at next OV. ?Plan: ?-Start Cymbalta 30mg  daily ?-Follow up in one month ? ? ? ?Past Medical History:  ?Diagnosis Date  ? Anxiety   ? Depression   ? GERD (gastroesophageal reflux disease)   ? Helicobacter pylori gastritis   ? ? ?Current Outpatient Medications on File Prior to Visit  ?Medication Sig Dispense Refill  ? DULoxetine (CYMBALTA) 30 MG capsule Take 1 tablet by mouth daily for 2 weeks, then increase to 2 tablets daily 60 capsule 1  ? gabapentin (NEURONTIN) 100 MG capsule Take 100 mg by mouth 3 (three) times daily.    ? linaclotide (LINZESS) 145 MCG CAPS  capsule Take 1 capsule (145 mcg total) by mouth daily before breakfast. 30 capsule 1  ? omeprazole (PRILOSEC) 40 MG capsule TAKE 1 CAPSULE (40 MG TOTAL) BY MOUTH DAILY. 30 capsule 2  ? ?No current facility-administered medications on file prior to visit.  ? ? ?Family History  ?Problem Relation Age of Onset  ? Healthy Mother   ? Colon cancer Neg Hx   ? Pancreatic cancer Neg Hx   ? Esophageal cancer Neg Hx   ? Liver cancer Neg Hx   ? Stomach cancer Neg Hx   ? ? ?Review of Systems: ?ROS negative except for what is noted on the assessment and plan. ? ?Vitals:  ? 05/21/21 1538  ?BP: 113/67  ?Pulse: (!) 59  ?Temp: 98.3 ?F (36.8 ?C)  ?TempSrc: Oral  ?SpO2: 97%  ?Weight: 168 lb 4.8 oz (76.3 kg)  ? ? ? ?Physical Exam: ?General: Appears older than stated age, normal weight body habitus, NAD ?HENT: normocephalic, atraumatic, external ears and nares appear unremarkable ?EYES: conjunctiva non-erythematous, no scleral icterus ?CV: regular rate, normal rhythm, no murmurs, rubs, gallops. ?Pulmonary: normal work of breathing on RA, lungs clear to auscultation, no rales, wheezes, rhonchi ?Abdominal: non-distended, soft, tenderness to palpation epigastrium, normal BS ?Skin: Warm and dry, no rashes or lesions on exposed surfaces ?Neurological: ?MS: awake, alert and oriented x3, normal speech and fund of knowledge ?Motor: moves all extremities antigravity ?  MSK: no erythema, swelling, increased calor, appear symmetrical, no joint effusions, full range of motion, no TTP ?Psych: normal affect ? ?  ? ?See Encounters Tab for problem based charting. ? ?Patient discussed with Dr. Evette Doffing ? ?Wayland Denis, M.D. ?[facility] Internal Medicine, PGY-1 ?Pager: [phone] ?Date 05/21/2021 Time 10:13 PM ? ?

## 2021-05-21 NOTE — Assessment & Plan Note (Addendum)
Patient has history of situational depression as well as symptoms of anxiety. He is not currently on an antidepressant.  Patient has expressed depressed mood since moving to the [place].  He is also been noted by other medical providers to have increased anxiety surrounding his symptoms especially his GI symptoms.   ? ?Patient is instructed to start Cymbalta 30 mg daily for functional dyspepsia associated with other pain symptoms such as knee pain and headache.  I anticipate this will help treat patient's depression and anxiety as well.  Discussed that Cymbalta can take several weeks before medication is fully effective.  Plan to increase dose at next OV. ?Plan: ?-Start Cymbalta 30mg  daily ?-Follow up in one month ?

## 2021-05-21 NOTE — Assessment & Plan Note (Signed)
Patient has one year history of bilateral knee pain. Knee pain occurs under two circumstances: 1. Concurrently with his GI symptoms 2. After a long day of work 12+ hours on his feet. No injury to the knees. Knee exam unremarkable today. Patient reports improvement in his knee pain when taking gabapentin. Discussed importance of avoiding aleve, motrin, naproxen etc due to dyspepsia.  ? ?Patient's knee pain which occurs concurrently with his abdominal pain is concerning for chronic pain syndrome vs the likely overuse pain that patient gets form long days at work.  I suspect starting Cymbalta can help both with functional dyspepsia as well as his other chronic pain symptoms that he associates with his GI symptoms.  We will continue gabapentin if this is helping.  Discussed using Tylenol up to 1000 mg 3 times daily instead of NSAIDs. ?Plan: ?-Start Cymbalta 30 mg daily ?-Start Tylenol up to 1000 mg 3 times daily as needed ?-Continue gabapentin 100 mg 3 times daily as needed ?-Avoid NSAIDs ?

## 2021-05-21 NOTE — Assessment & Plan Note (Addendum)
Patient was referred to gastroenterology for long history of epigastric pain/dyspepsia. Had EGD performed in April 2022 which showed hiatal hernia, negative for H. Pylori, no ulcers or malignancy. 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  CT scan abdomen pelvis in March 2022 was normal.  RUQ ultrasound ruled out gallstones.  On omeprazole which helps with his acid reflux symptoms. Still having significant symptoms (reflux, bloating) with recent worsening after stating new medications prescribed by GI. ? ?Patient recently seen for follow-up 5/11 by Dr. Adela Lank with [facility] for dyspepsia.  GI feels patient may have functional dyspepsia though given symptoms of early satiety differential includes underlying gastroparesis.  For this GI wanted to trial Reglan.  They started patient on Cymbalta 30 mg for functional dyspepsia which may also help with his anxiety, was told to discontinue BuSpar.  He was instructed to start Cymbalta for 2 weeks and then if tolerating, start Reglan low-dose 3 times a day for 1 week.  Unfortunately, patient is unable to speak or read English, and I suspect that he did not understand the instructions to start Cymbalta first.  He started both medications together and had worsening of his GI upset symptoms.  Patient is not sure which medication caused worsening of symptoms.  Patient is also not sure which medication he was told to discontinue.  We also discussed patient's chronic constipation for which she has previously tried MiraLAX though was unable to tolerate this due to worsening reflux symptoms.  Patient was instructed to try Linzess samples to see if this helps though patient was hesitant to start this after side effects from his other 2 medications. ? ?Today, patient also notes that when he gets his GI symptoms he concurrently has a headache, racing heart  rate, joint pain.  On exam, no red hot swollen joints, patient has mild TTP of epigastric area.  Otherwise physical exam unremarkable. ? ?On assessment, patient has history of chronic abdominal symptoms/dyspepsia with likely diagnosis of functional dyspepsia for which he was recently prescribed Cymbalta.  GI's differential also included gastroparesis for which he was also started on Reglan.  Unfortunately patient did not adhere to medication initiation timing instructions likely secondary to language barrier and started both medications at the same time and had side effects with this.  Unclear which medication caused side effects.  Given multiple associated chronic pain symptoms which patient has only during his episodes of abdominal symptoms, suspect functional dyspepsia is a large component of his symptom burden.  Patient would benefit from retrialing Cymbalta and holding off on Reglan for now. ?Plan: ?-Restart Cymbalta 30 mg daily, increase in one month if tolerating ?-Hold Reglan ?-Continue Omeprazole ?-Start Linzess for constipation if he is tolerating Cymbalta ?-AVS printed in Pashto (google translate) and proofread by interpreter (no errors in written translation that she noted) ?-Discussed eating high-fiber diet, increasing water intake, and trialing other over-the-counter medications for constipation ?-Follow up in one month ?-Follow up with GI, office information provided ?

## 2021-05-22 ENCOUNTER — Encounter: Payer: BC Managed Care – PPO | Admitting: Internal Medicine

## 2021-05-22 NOTE — Progress Notes (Signed)
Internal Medicine Clinic Attending ? ?Case discussed with Dr. Zinoviev  At the time of the visit.  We reviewed the resident?s history and exam and pertinent patient test results.  I agree with the assessment, diagnosis, and plan of care documented in the resident?s note.  ?

## 2021-06-08 ENCOUNTER — Other Ambulatory Visit: Payer: Self-pay | Admitting: Gastroenterology

## 2021-07-05 ENCOUNTER — Other Ambulatory Visit: Payer: Self-pay | Admitting: Gastroenterology

## 2021-07-29 ENCOUNTER — Encounter: Payer: Self-pay | Admitting: Gastroenterology

## 2021-10-10 ENCOUNTER — Other Ambulatory Visit: Payer: Self-pay | Admitting: Gastroenterology

## 2022-01-24 ENCOUNTER — Other Ambulatory Visit: Payer: Self-pay | Admitting: Gastroenterology

## 2022-01-27 ENCOUNTER — Telehealth: Payer: Self-pay | Admitting: Gastroenterology

## 2022-01-27 NOTE — Telephone Encounter (Signed)
Called patient via interpreter services (Interpreter ID: [id number]). Pt states that the medication is working fine and he just needed a refill. I informed patient that refill was sent in on 1/21 and he should be available to pick up from pharmacy. Pt also overdue for follow up appt so he has been scheduled for a f/u appt with Dr. Havery Moros on Tuesday, 02/11/22 at 1:40 pm. Patient requested that appt information be mailed to him. Pt had no concerns at the end of the call.

## 2022-02-11 ENCOUNTER — Ambulatory Visit (INDEPENDENT_AMBULATORY_CARE_PROVIDER_SITE_OTHER): Payer: Self-pay | Admitting: Gastroenterology

## 2022-02-11 ENCOUNTER — Encounter: Payer: Self-pay | Admitting: Gastroenterology

## 2022-02-11 VITALS — BP 106/70 | HR 72 | Ht 68.5 in | Wt 185.4 lb

## 2022-02-11 DIAGNOSIS — K219 Gastro-esophageal reflux disease without esophagitis: Secondary | ICD-10-CM

## 2022-02-11 DIAGNOSIS — R1013 Epigastric pain: Secondary | ICD-10-CM

## 2022-02-11 DIAGNOSIS — F32A Depression, unspecified: Secondary | ICD-10-CM

## 2022-02-11 MED ORDER — METOCLOPRAMIDE HCL 5 MG PO TABS: ORAL_TABLET | ORAL | 3 refills | Status: AC

## 2022-02-11 MED ORDER — OMEPRAZOLE 40 MG PO CPDR: 40.0000 mg | DELAYED_RELEASE_CAPSULE | Freq: Every day | ORAL | 3 refills | Status: DC

## 2022-02-11 NOTE — Patient Instructions (Addendum)
If you are age 39 or older, your body mass index should be between 23-30. Your Body mass index is 27.78 kg/m. If this is out of the aforementioned range listed, please consider follow up with your Primary Care Provider.  If you are age 55 or younger, your body mass index should be between 19-25. Your Body mass index is 27.78 kg/m. If this is out of the aformentioned range listed, please consider follow up with your Primary Care Provider.   ________________________________________________________  We are giving you information regarding [facility] through the [employer].  We have sent the following medications to your pharmacy for you to pick up at your convenience: Omeprazole 40 mg: Take once daily  Reglan 5 mg: Take prior to a meal as needed  Thank you for entrusting me with your care and for choosing [facility], Dr. [place] Cellar

## 2022-02-11 NOTE — Progress Notes (Signed)
HPI :  37 year old male here for a follow-up visit for dyspepsia, GERD.    Recall he is from [place] and does not speak any [place].  Interpreter present today to provide history.  I have not seen him since May 2023.  Recall he has had a longstanding history of digestive complaints including GERD, early satiety, dyspepsia.  Had a history of H. pylori previously in [place] for which he was treated. He underwent an upper endoscopy with me on April 11 2020.  Remarkable findings were 1 cm hiatal hernia.  His stomach was normal, biopsies for H. pylori eradication showed no evidence of persistent H. pylori.  Small bowel was normal, biopsies were normal as well. 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  We had discussed that it is controversial whether or not this is pathogenic but in light of his symptoms we treated him with a course of Flagyl.  He had a follow-up stool study on May 20 that was negative for any persistent Blastocystis.  CT scan in March 2022 was normal. At a prior visit he had endorsed some NSAID use.  I had asked him to stop using NSAIDs and to continue the omeprazole.  I also added some Zofran and FD guard.  I also gave him some samples of MiraLAX and asked him to purchase that over-the-counter for constipation.  He had a right upper quadrant ultrasound but otherwise ruled out gallstones.   Recall he had some anxiety along with some of this.  I previously had placed on some buspirone which she took for a period of time but eventually stopped it as the effect waned.  I had discussed options with him, at the last visit I thought he had functional dyspepsia and with his anxiety recommended treating that.  I started him on Cymbalta.  Sounds like he took this for short period of time and states it made him feel shaky and dizzy and he did not like the way he felt on it and stopped it.  I have not seen him for several months.  He  states he takes omeprazole 40 mg once daily and stopped all of his other medicines.  If he eats late and avoids heavy foods he is actually doing pretty well regimen likes it.  He is doing better than the last time I saw him.  However if he eats heavy foods or high volume, he tends to have some early satiety and has some worsening reflux.  He has occasional nausea but does not vomit.  We previously had discussed considering a trial of Reglan for possible component of gastroparesis, he never tried that yet.  He clearly has some food sensitivities that bother him.  He otherwise states he does have feelings of depression and is not taking anything for that.  He also gets sense of dizziness in his head at times, sounds like sometimes related to eating but sometimes not, he inquires about that.  He is not currently need to take anything for his bowels.  Of note he states his insurance lapsed and he is currently uninsured, has questions about where he can get healthcare insurance.     Prior work-up: EGD 04/11/20 -  - A 1 cm hiatal hernia was present. 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 The exam of the esophagus was otherwise normal. - The entire examined stomach was normal. Biopsies were taken  with a cold forceps for Helicobacter pylori testing. - The duodenal bulb and second portion of the duodenum were normal. Biopsies for histology were taken with a cold forceps for evaluation of celiac disease.     1. Surgical [P], duodenal - BENIGN DUODENAL MUCOSA - NO ACUTE INFLAMMATION, VILLOUS BLUNTING OR INCREASED INTRAEPITHELIAL LYMPHOCYTES IDENTIFIED 2. Surgical [P], gastric antrum and gastric body - BENIGN GASTRIC MUCOSA WITH FEATURES CONSISTENT WITH PROTON PUMP INHIBITOR EFFECT - NO H. PYLORI, INTESTINAL METAPLASIA OR MALIGNANCY IDENTIFIED   Stool ova / parasite 05/25/20 - negative   CT scan abdomen pelvis with contrast 03/23/20 - negative     RUQ [place] 07/11/20  -  IMPRESSION: Focal fatty infiltration along the falciform ligament. Otherwise unremarkable right upper quadrant ultrasound. No gallstones    CT scan abdomen / pelvis with contrast 07/31/20 -  IMPRESSION: 1. No acute intra-abdominal or pelvic pathology. No bowel obstruction. Normal appendix. 2. There is a 4 mm nodule in the right lower lobe. No follow-up needed if patient is low-risk. Non-contrast chest CT can be considered in 12 months if patient is high-risk. This recommendation follows the consensus statement: Guidelines for Management of Incidental Pulmonary Nodules Detected on CT Images: From the Fleischner Society 2017; Radiology 2017; [phone]    Past Medical History:  Diagnosis Date   Anxiety    Depression    Dyspepsia    GERD (gastroesophageal reflux disease)    Helicobacter pylori gastritis      Past Surgical History:  Procedure Laterality Date   INGUINAL HERNIA REPAIR Left 08/01/2020   Procedure: LAPAROSCOPIC INGUINAL HERNIA WITH MESH;  Surgeon: Felicie Morn, MD;  Location: [facility];  Service: General;  Laterality: Left;   UPPER GI ENDOSCOPY  04/11/2020   Family History  Problem Relation Age of Onset   Healthy Mother    Colon cancer Neg Hx    Pancreatic cancer Neg Hx    Esophageal cancer Neg Hx    Liver cancer Neg Hx    Stomach cancer Neg Hx    Social History   Tobacco Use   Smoking status: Former    Types: Cigarettes   Smokeless tobacco: Former    Types: Snuff  Vaping Use   Vaping Use: Never used  Substance Use Topics   Alcohol use: Never   Drug use: Never   Current Outpatient Medications  Medication Sig Dispense Refill   metoCLOPramide (REGLAN) 5 MG tablet Take 30 minutes before a meal 30 tablet 3   DULoxetine (CYMBALTA) 30 MG capsule TAKE 1 TABLET BY MOUTH DAILY FOR 2 WEEKS, THEN INCREASE TO 2 TABLETS DAILY (Patient not taking: Reported on 02/11/2022) 180 capsule 1   gabapentin (NEURONTIN) 100 MG capsule Take 100 mg by mouth 3 (three)  times daily. (Patient not taking: Reported on 02/11/2022)     linaclotide (LINZESS) 145 MCG CAPS capsule Take 1 capsule (145 mcg total) by mouth daily before breakfast. (Patient not taking: Reported on 02/11/2022) 30 capsule 1   omeprazole (PRILOSEC) 40 MG capsule Take 1 capsule (40 mg total) by mouth daily. 90 capsule 3   No current facility-administered medications for this visit.   No Known Allergies   Review of Systems: All systems reviewed and negative except where noted in HPI.     Physical Exam: BP 106/70   Pulse 72   Ht 5' 8.5" (1.74 m)   Wt 185 lb 6 oz (84.1 kg)   SpO2 97%   BMI 27.78 kg/m  Constitutional: Pleasant,well-developed, male in no  acute distress. Neurological: Alert and oriented to person place and time. Psychiatric: Normal mood and affect. Behavior is normal.   ASSESSMENT: 37 y.o. male here for assessment of the following  1. Gastroesophageal reflux disease, unspecified whether esophagitis present   2. Dyspepsia   3. Depression, unspecified depression type    As above, longstanding GERD associated with what I feel is most consistent with functional dyspepsia although possible underlying mild gastroparesis.  He has a lot of overlap with depression and anxiety.  He unfortunately did not tolerate Cymbalta.  Buspirone worked for a short period of time and then it waned and he stopped it.  I do think he needs better management of his depression/anxiety and we will refer him back to his primary care for that as he states it is not being addressed.  He clearly has benefited from taking omeprazole once daily.  He states if he takes it twice a day this causes nausea, so we will stick with the once daily dosing.  He will try to avoid trigger foods and generally this keeps him feeling fairly well.  I discussed some other options with him, will try adding low-dose Reglan 5 mg prior to meal as needed if he thinks he is going to eat something that we will typically give him a  problem.  He can let me know how this goes.  I discussed risks of tar dive dyskinesia which is incredibly rare, at low-dose to be very low risk for that.  Otherwise we discussed a bit his need to get insurance and for him to get his medications and see care he will need to get insurance and our staff provided some assistance in regards to where he can get some help with this.  He agrees with the plan.   PLAN: - refill omeprazole 40mg  / day  - add trial of Reglan 5mg  - 1 tab prior to meal as needed - avoid known food triggers - refer back to primary care for management of depression / dizziness - referred him to patient assistance for help with insurance  Jolly Mango, MD [facility]

## 2022-02-21 ENCOUNTER — Encounter: Payer: Self-pay | Admitting: Student

## 2022-03-21 IMAGING — US US ABDOMEN LIMITED
1 series · 14 of 25 positions shown · non-contrast
Comparison: CT 03/23/2020

CLINICAL DATA: Epigastric pain and nausea

EXAM:
ULTRASOUND ABDOMEN LIMITED RIGHT UPPER QUADRANT

[Series 1: us abdomen limited · 14 of 44 slices shown]
[im 1/44]
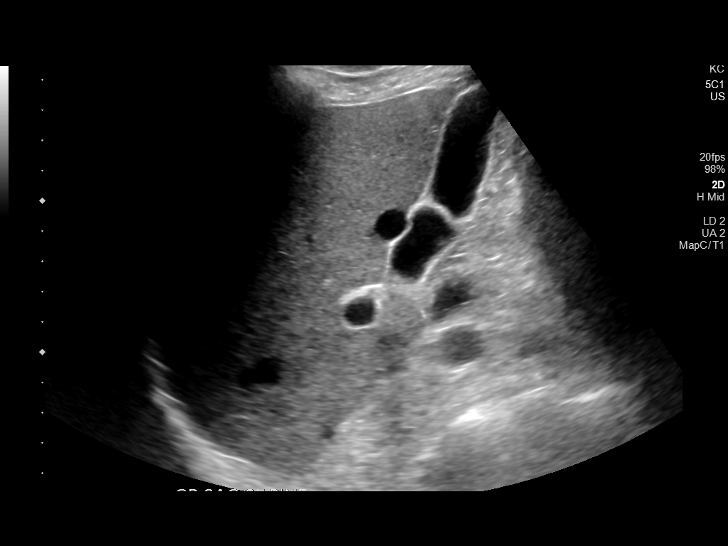
[im 4/44]
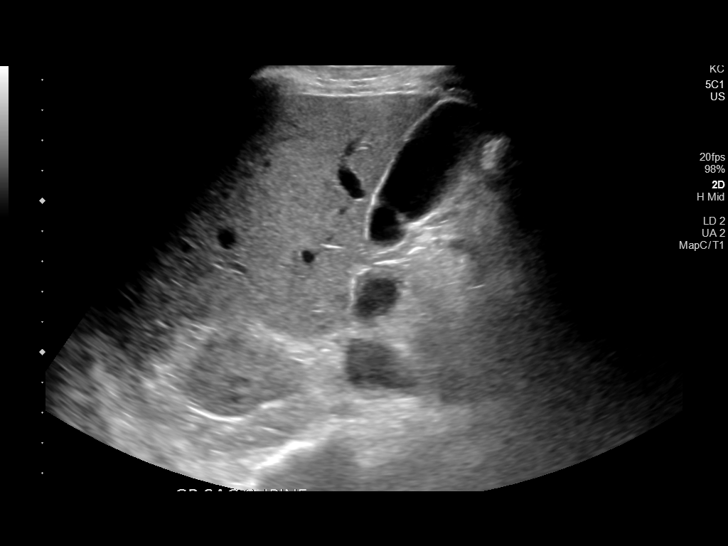
[im 8/44]
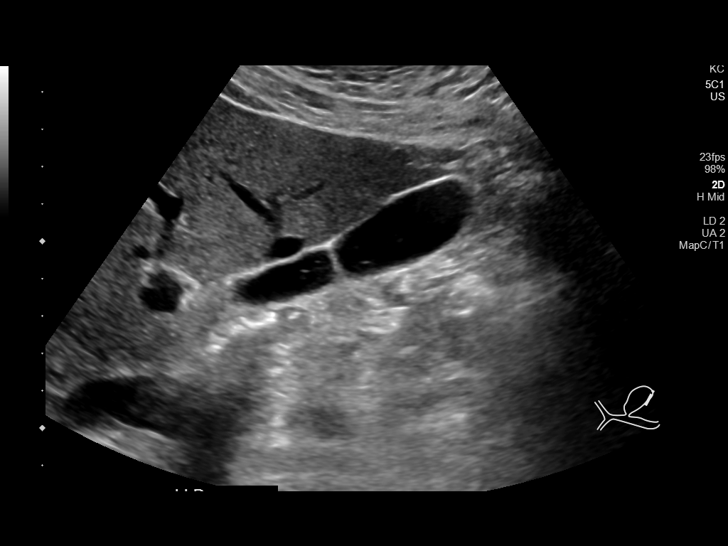
[im 11/44]
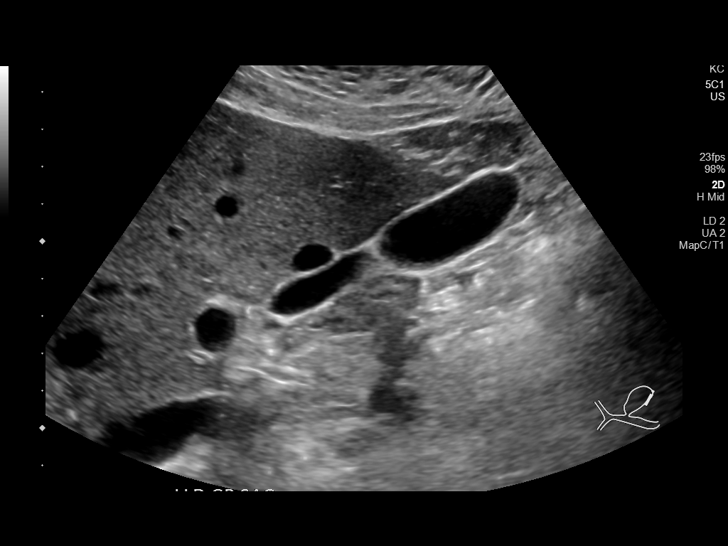
[im 15/44]
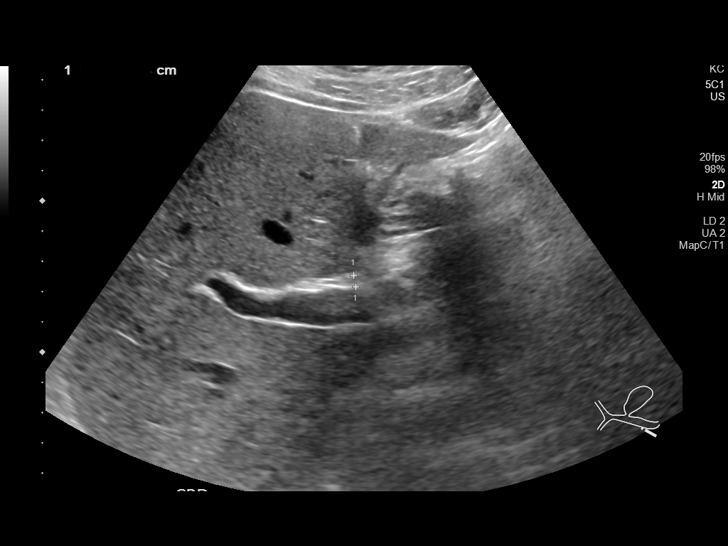
[im 17/44]
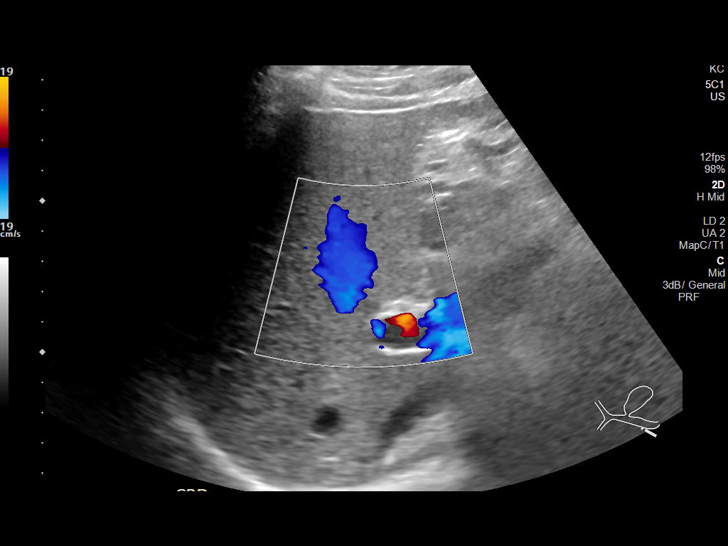
[im 20/44]
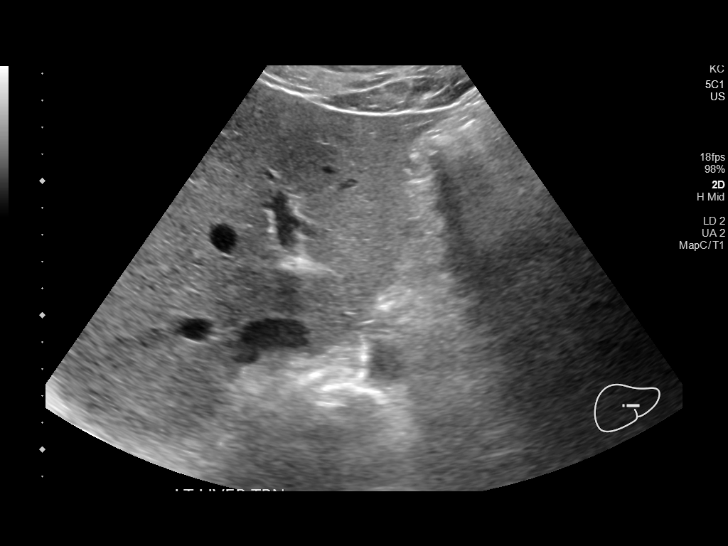
[im 24/44]
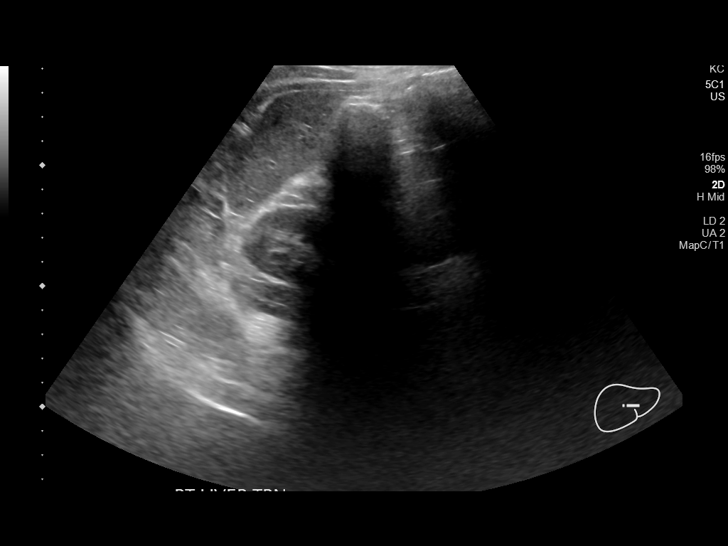
[im 27/44]
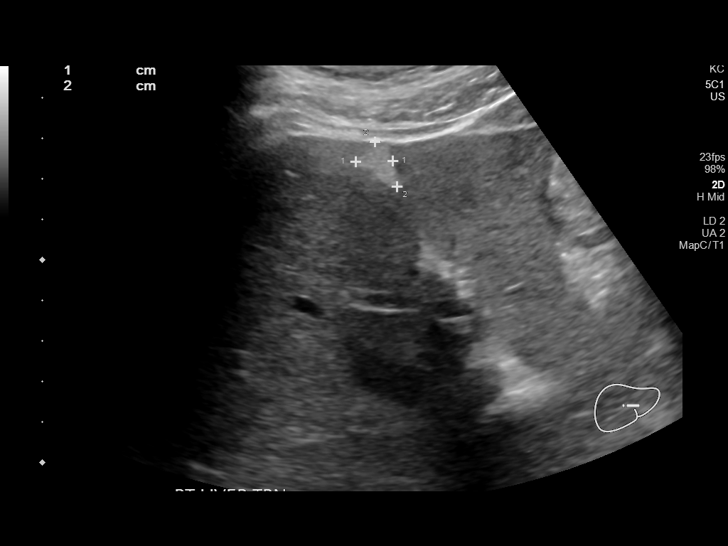
[im 29/44]
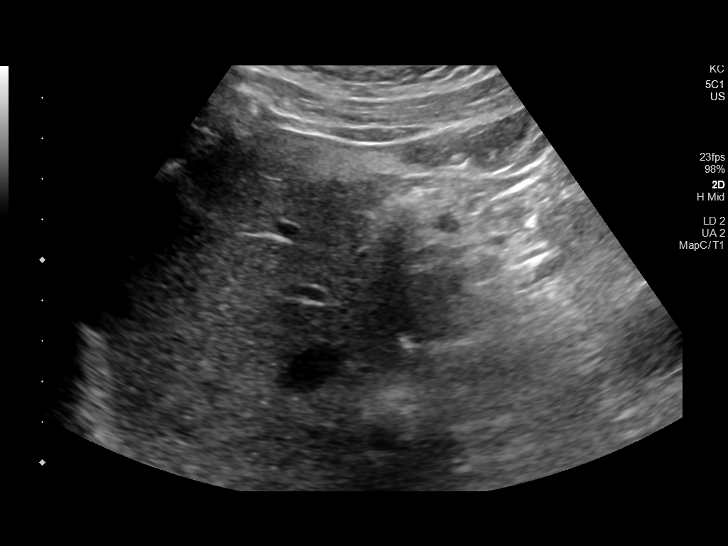
[im 33/44]
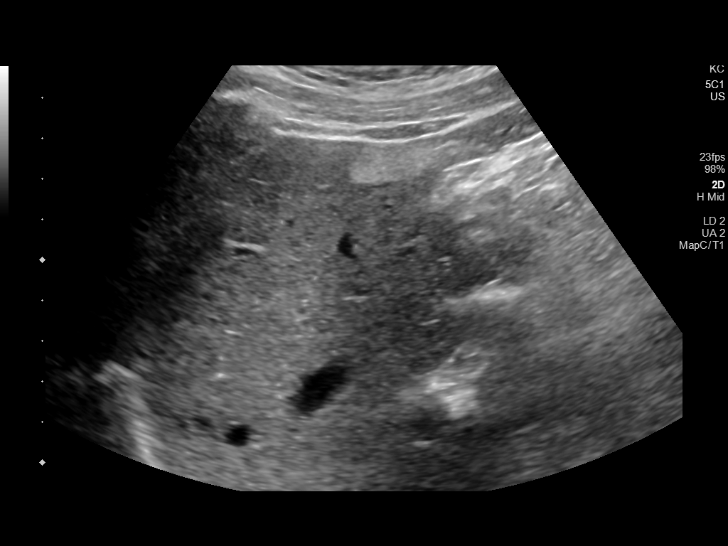
[im 36/44]
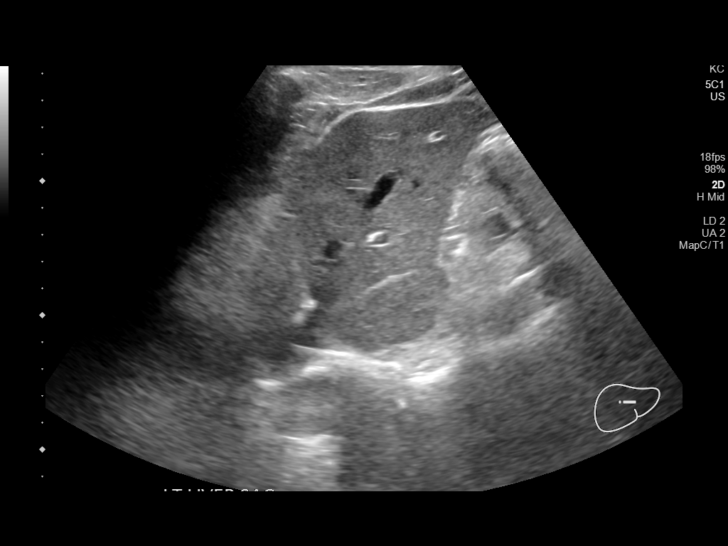
[im 40/44]
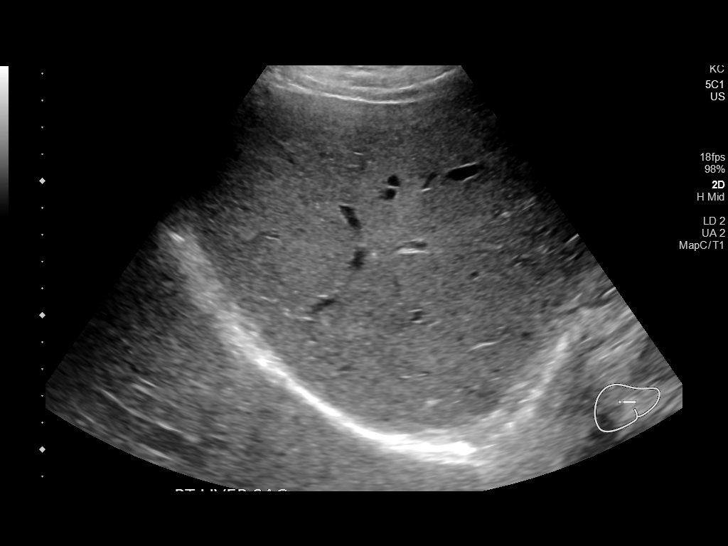
[im 44/44]
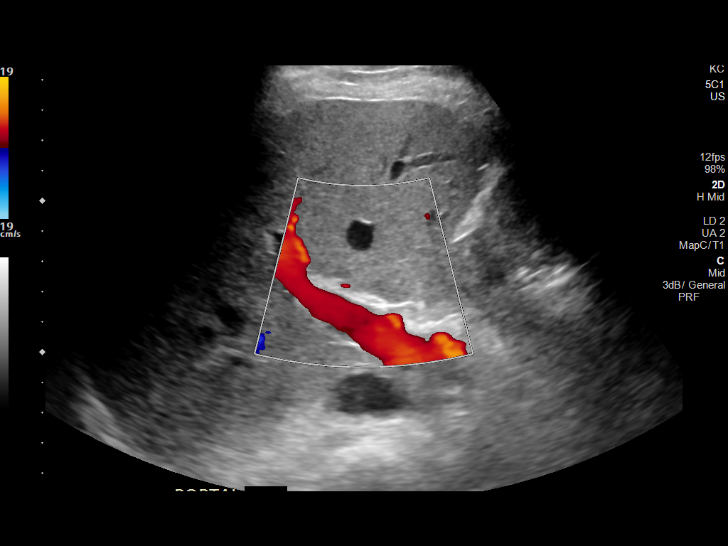

[14 of 25 positions shown; findings below may reference images not displayed]

FINDINGS: Gallbladder:

No gallstones or wall thickening visualized. No sonographic Murphy
sign noted by sonographer.

Common bile duct:

Diameter: 4 mm

Liver:

Peripheral hyperechoic area measuring 2.2 x 1.2 x 0.9 cm along the
falciform ligament, similar to prior CT and likely representing
focal fatty infiltration. Otherwise, the echogenicity of the hepatic
parenchyma is within normal limits. Portal vein is patent on color
Doppler imaging with normal direction of blood flow towards the
liver.

Other: None.
IMPRESSION: Focal fatty infiltration along the falciform ligament. Otherwise
unremarkable right upper quadrant ultrasound.

## 2022-04-10 IMAGING — CT CT ABD-PELV W/ CM
2 of 4 series · 15 of 46 positions shown, 17 images · IV contrast (omnipaque)
Comparison: CT abdomen pelvis dated 03/23/2020. ultrasound dated
07/11/2020.

CLINICAL DATA: 34-year-old male with abdominal pain. Concern for
hernia.

EXAM:
CT ABDOMEN AND PELVIS WITH CONTRAST
TECHNIQUE: Multidetector CT imaging of the abdomen and pelvis was performed
using the standard protocol following bolus administration of
intravenous contrast.
CONTRAST:  80mL OMNIPAQUE IOHEXOL 350 MG/ML SOLN

[Series 2: axial st · axial · 0.79mm/px · z∈[-486,-60]mm · 12 of 97 slices shown, 14 images]
[im 6/97  soft-tissue]
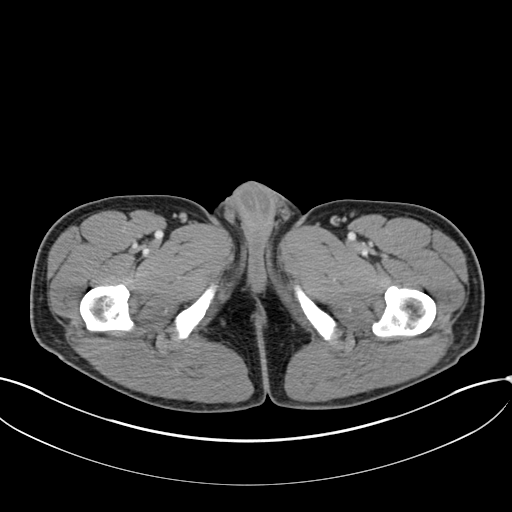
[im 6/97  bone]
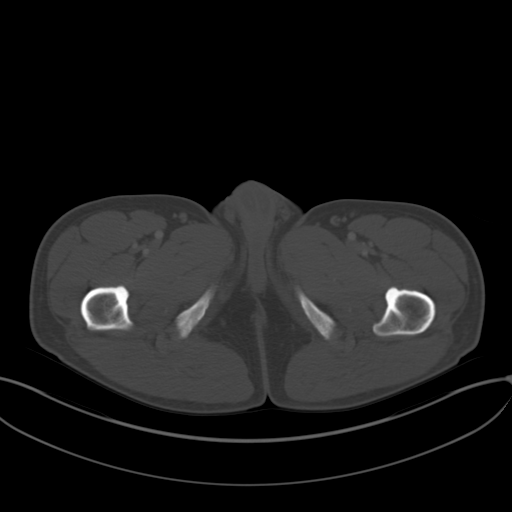
[im 16/97  soft-tissue]
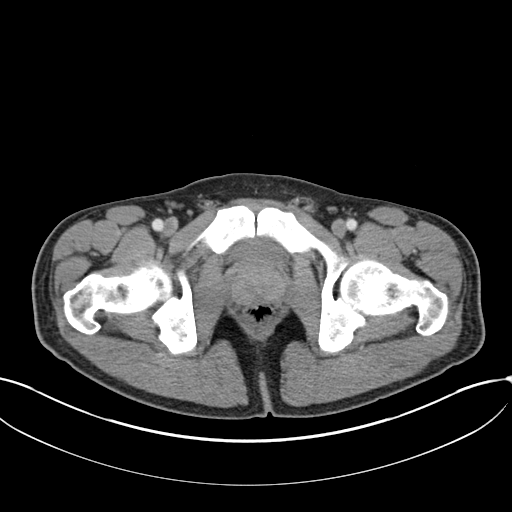
[im 21/97  soft-tissue]
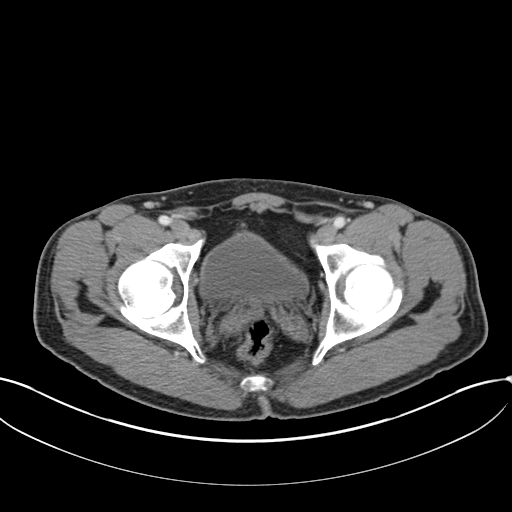
[im 31/97  soft-tissue]
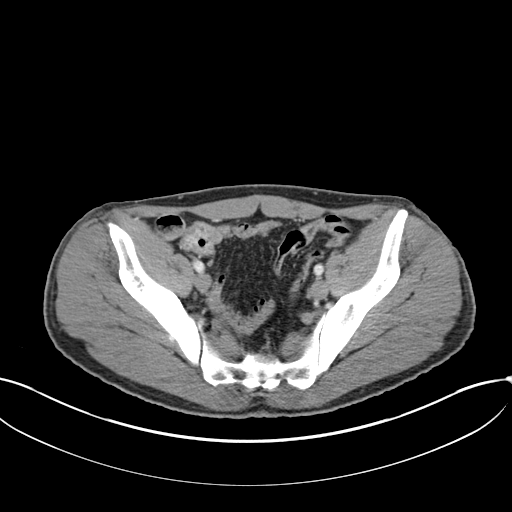
[im 36/97  soft-tissue]
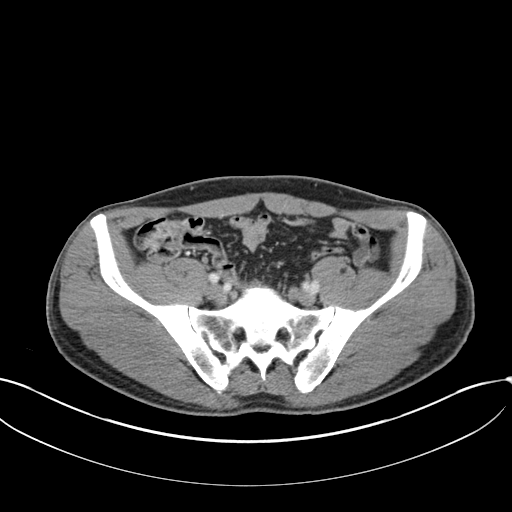
[im 46/97  soft-tissue]
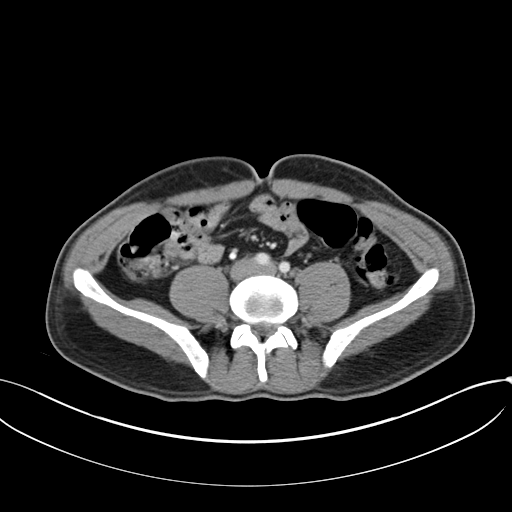
[im 51/97  soft-tissue]
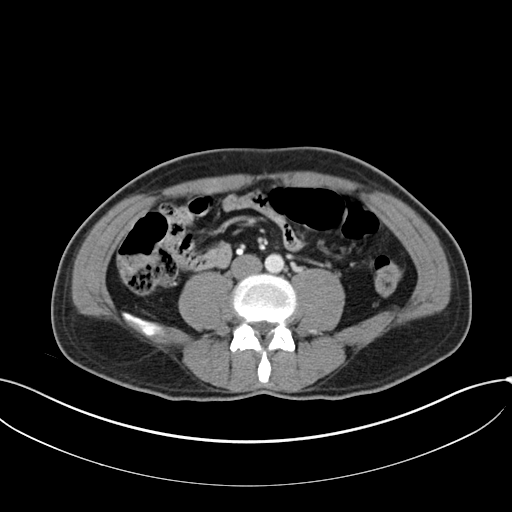
[im 61/97  soft-tissue]
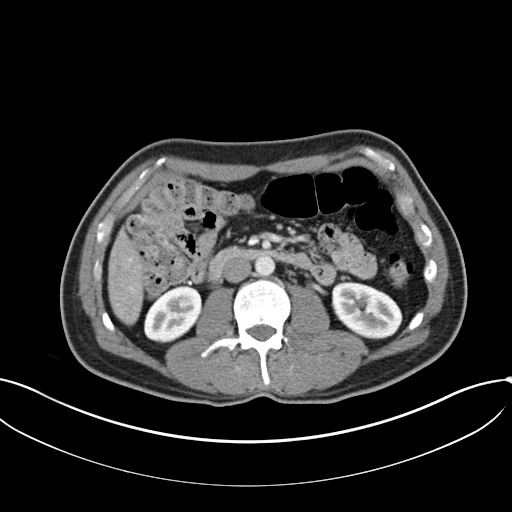
[im 66/97  soft-tissue]
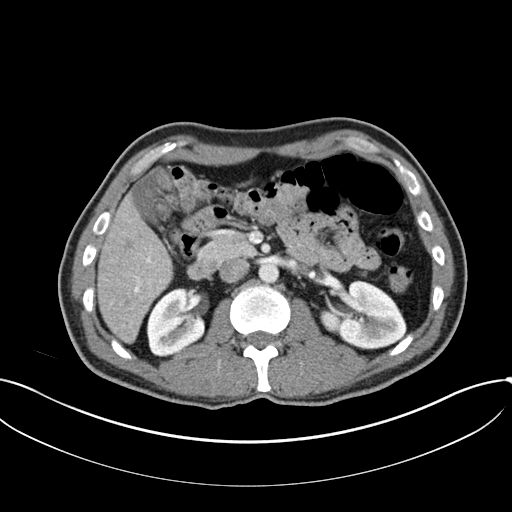
[im 66/97  bone]
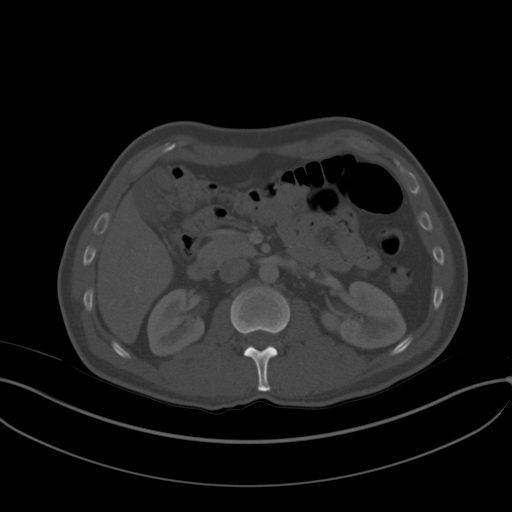
[im 76/97  soft-tissue]
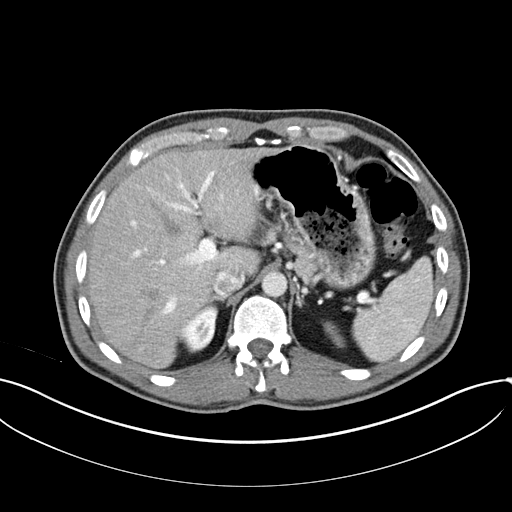
[im 81/97  soft-tissue]
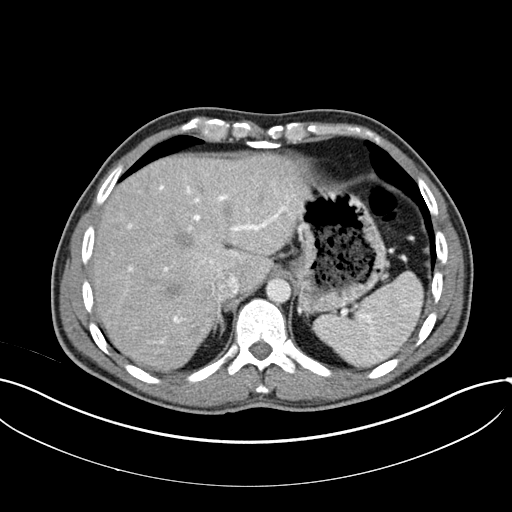
[im 91/97  soft-tissue]
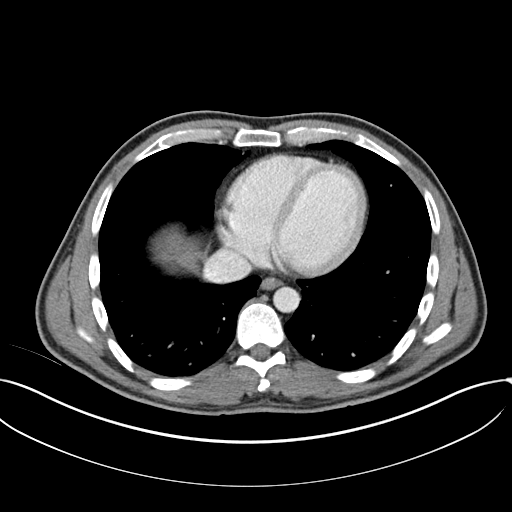

[Series 5: coronal st · coronal · 0.68mm/px · 3 of 119 slices shown]
[im 40/119  soft-tissue]
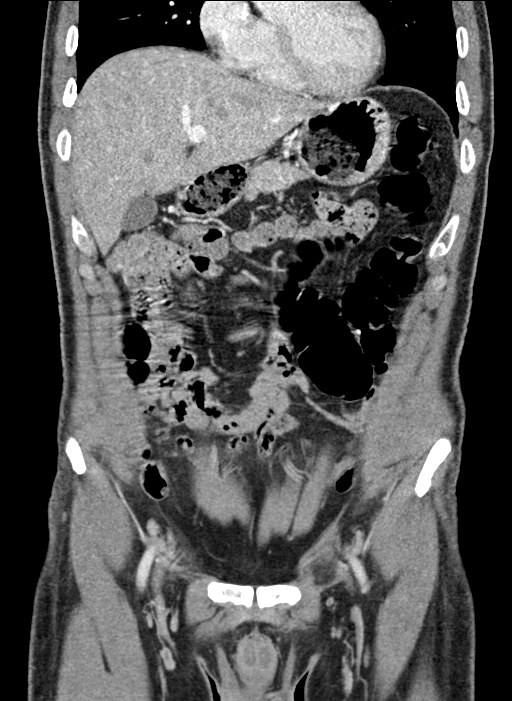
[im 53/119  soft-tissue]
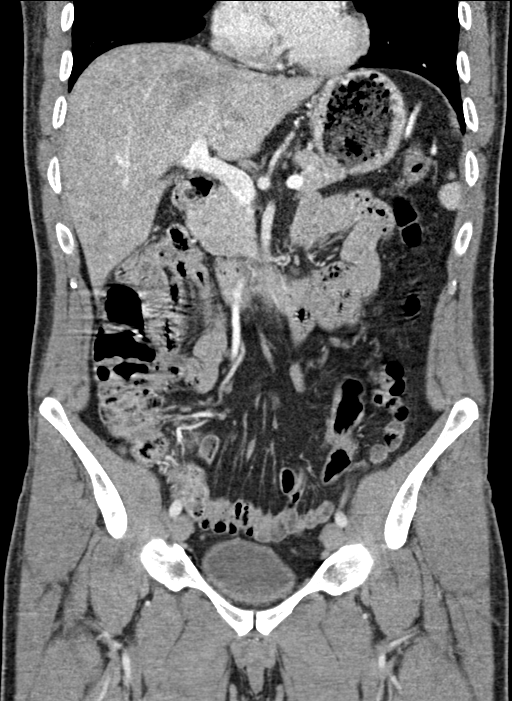
[im 66/119  soft-tissue]
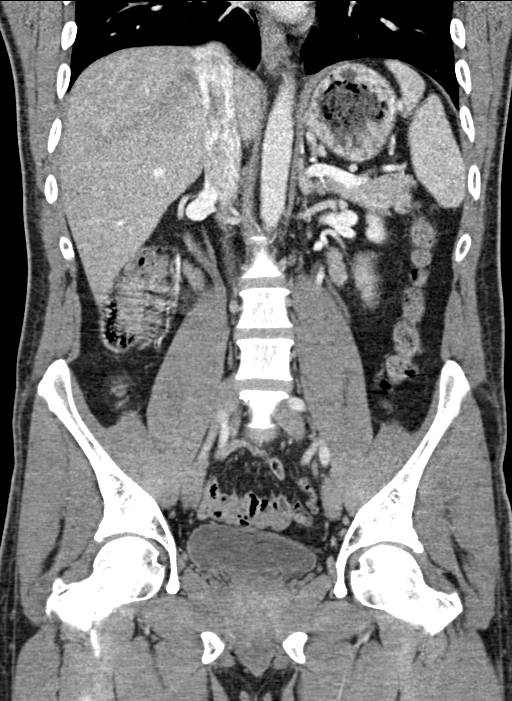

[15 of 46 positions shown; findings below may reference images not displayed]

FINDINGS: Evaluation of this exam is limited due to respiratory motion
artifact.

Lower chest: There is a 4 mm nodule in the right lower lobe (3/57).
The visualized lung bases are otherwise clear.

No intra-abdominal free air or free fluid.

Hepatobiliary: No focal liver abnormality is seen. No gallstones,
gallbladder wall thickening, or biliary dilatation.

Pancreas: Unremarkable. No pancreatic ductal dilatation or
surrounding inflammatory changes.

Spleen: Normal in size without focal abnormality.

Adrenals/Urinary Tract: The adrenal glands unremarkable. The
kidneys, visualized ureters, and urinary bladder appear
unremarkable.

Stomach/Bowel: There is no bowel obstruction or active inflammation.
The appendix is normal.

Vascular/Lymphatic: The abdominal aorta and IVC unremarkable. No
portal venous gas. There is no adenopathy.

Reproductive: The prostate and seminal vesicles are grossly
unremarkable. No pelvic mass.

Other: None

Musculoskeletal: No acute or significant osseous findings.
IMPRESSION: 1. No acute intra-abdominal or pelvic pathology. No bowel
obstruction. Normal appendix.
2. There is a 4 mm nodule in the right lower lobe. No follow-up
needed if patient is low-risk. Non-contrast chest CT can be
considered in 12 months if patient is high-risk. This recommendation
follows the consensus statement: Guidelines for Management of
Incidental Pulmonary Nodules Detected on CT Images: From the

## 2022-06-09 ENCOUNTER — Encounter: Payer: Self-pay | Admitting: *Deleted

## 2023-02-21 ENCOUNTER — Other Ambulatory Visit: Payer: Self-pay | Admitting: Gastroenterology

## 2023-11-02 ENCOUNTER — Telehealth: Payer: Self-pay | Admitting: Gastroenterology

## 2023-11-02 MED ORDER — OMEPRAZOLE 40 MG PO CPDR: 40.0000 mg | DELAYED_RELEASE_CAPSULE | Freq: Every day | ORAL | 0 refills | Status: AC

## 2023-11-02 NOTE — Telephone Encounter (Signed)
 Omeprazole refilled.

## 2023-11-02 NOTE — Telephone Encounter (Signed)
 PT needs a refill for omeprazole  sent to [facility] on [address]. He needs an appointment  but cannot be seen until December. He only has two days of medication available. Please advise.

## 2023-12-17 ENCOUNTER — Ambulatory Visit: Admitting: Nurse Practitioner
